# Patient Record
Sex: Male | Born: 1990 | Race: White | Hispanic: Yes | Marital: Married | Smoking: Never smoker
Health system: Southern US, Community
[De-identification: ages and names within clinical notes are randomized; demographics above are authoritative.]

---

## 2015-10-11 ENCOUNTER — Ambulatory Visit
Admission: EM | Admit: 2015-10-11 | Discharge: 2015-10-11 | Disposition: A | Discharge status: Home / Self Care | Payer: Self-pay | Attending: Family Medicine | Admitting: Family Medicine

## 2015-10-11 ENCOUNTER — Ambulatory Visit (INDEPENDENT_AMBULATORY_CARE_PROVIDER_SITE_OTHER): Payer: Self-pay

## 2015-10-11 ENCOUNTER — Encounter: Payer: Self-pay | Admitting: *Deleted

## 2015-10-11 DIAGNOSIS — M10072 Idiopathic gout, left ankle and foot: Secondary | ICD-10-CM

## 2015-10-11 DIAGNOSIS — M109 Gout, unspecified: Secondary | ICD-10-CM

## 2015-10-11 LAB — BASIC METABOLIC PANEL
Anion gap: 8 (ref 5–15)
BUN: 25 mg/dL — AB (ref 6–20)
CHLORIDE: 103 mmol/L (ref 101–111)
CO2: 26 mmol/L (ref 22–32)
Calcium: 9.3 mg/dL (ref 8.9–10.3)
Creatinine, Ser: 1.57 mg/dL — ABNORMAL HIGH (ref 0.61–1.24)
GFR calc non Af Amer: >60 mL/min (ref >60)
Glucose, Bld: 126 mg/dL — ABNORMAL HIGH (ref 65–99)
POTASSIUM: 3.9 mmol/L (ref 3.5–5.1)
SODIUM: 137 mmol/L (ref 135–145)

## 2015-10-11 LAB — URIC ACID: URIC ACID, SERUM: 11.9 mg/dL — AB (ref 4.4–7.6)

## 2015-10-11 LAB — CBC WITH DIFFERENTIAL/PLATELET
Basophils Absolute: 0.1 10*3/uL (ref 0–0.1)
Basophils Relative: 1 %
EOS ABS: 0.2 10*3/uL (ref 0–0.7)
Eosinophils Relative: 3 %
HCT: 43.1 % (ref 40.0–52.0)
HEMOGLOBIN: 14.8 g/dL (ref 13.0–18.0)
LYMPHS ABS: 2.3 10*3/uL (ref 1.0–3.6)
Lymphocytes Relative: 30 %
MCH: 29.3 pg (ref 26.0–34.0)
MCHC: 34.4 g/dL (ref 32.0–36.0)
MCV: 85.3 fL (ref 80.0–100.0)
Monocytes Absolute: 0.4 10*3/uL (ref 0.2–1.0)
Monocytes Relative: 6 %
NEUTROS ABS: 4.5 10*3/uL (ref 1.4–6.5)
NEUTROS PCT: 60 %
Platelets: 263 10*3/uL (ref 150–440)
RBC: 5.06 MIL/uL (ref 4.40–5.90)
RDW: 12.4 % (ref 11.5–14.5)
WBC: 7.6 10*3/uL (ref 3.8–10.6)

## 2015-10-11 MED ORDER — OXYCODONE-ACETAMINOPHEN 5-325 MG PO TABS: 1.0000 | ORAL_TABLET | Freq: Three times a day (TID) | ORAL | Status: DC | PRN

## 2015-10-11 MED ORDER — PREDNISONE 10 MG PO TABS: ORAL_TABLET | ORAL | Status: DC

## 2015-10-11 NOTE — ED Notes (Addendum)
Patient started having symptoms of left foot pain 4 days ago and the pain became severe last PM. Swelling is visible at the joint above the left great toe. Patient reports having the same pain in the same location 1 year ago.

## 2015-10-11 NOTE — Discharge Instructions (Signed)
Take medication as prescribed. Rest. Drink plenty of fluids. Elevate and rest.   Follow up with your primary care physician this week as needed. Return to Urgent care for new or worsening concerns.   Gout Gout is an inflammatory arthritis caused by a buildup of uric acid crystals in the joints. Uric acid is a chemical that is normally present in the blood. When the level of uric acid in the blood is too high it can form crystals that deposit in your joints and tissues. This causes joint redness, soreness, and swelling (inflammation). Repeat attacks are common. Over time, uric acid crystals can form into masses (tophi) near a joint, destroying bone and causing disfigurement. Gout is treatable and often preventable. CAUSES  The disease begins with elevated levels of uric acid in the blood. Uric acid is produced by your body when it breaks down a naturally found substance called purines. Certain foods you eat, such as meats and fish, contain high amounts of purines. Causes of an elevated uric acid level include:  Being passed down from parent to child (heredity).  Diseases that cause increased uric acid production (such as obesity, psoriasis, and certain cancers).  Excessive alcohol use.  Diet, especially diets rich in meat and seafood.  Medicines, including certain cancer-fighting medicines (chemotherapy), water pills (diuretics), and aspirin.  Chronic kidney disease. The kidneys are no longer able to remove uric acid well.  Problems with metabolism. Conditions strongly associated with gout include:  Obesity.  High blood pressure.  High cholesterol.  Diabetes. Not everyone with elevated uric acid levels gets gout. It is not understood why some people get gout and others do not. Surgery, joint injury, and eating too much of certain foods are some of the factors that can lead to gout attacks. SYMPTOMS   An attack of gout comes on quickly. It causes intense pain with redness, swelling,  and warmth in a joint.  Fever can occur.  Often, only one joint is involved. Certain joints are more commonly involved:  Base of the big toe.  Knee.  Ankle.  Wrist.  Finger. Without treatment, an attack usually goes away in a few days to weeks. Between attacks, you usually will not have symptoms, which is different from many other forms of arthritis. DIAGNOSIS  Your caregiver will suspect gout based on your symptoms and exam. In some cases, tests may be recommended. The tests may include:  Blood tests.  Urine tests.  X-rays.  Joint fluid exam. This exam requires a needle to remove fluid from the joint (arthrocentesis). Using a microscope, gout is confirmed when uric acid crystals are seen in the joint fluid. TREATMENT  There are two phases to gout treatment: treating the sudden onset (acute) attack and preventing attacks (prophylaxis).  Treatment of an Acute Attack.  Medicines are used. These include anti-inflammatory medicines or steroid medicines.  An injection of steroid medicine into the affected joint is sometimes necessary.  The painful joint is rested. Movement can worsen the arthritis.  You may use warm or cold treatments on painful joints, depending which works best for you.  Treatment to Prevent Attacks.  If you suffer from frequent gout attacks, your caregiver may advise preventive medicine. These medicines are started after the acute attack subsides. These medicines either help your kidneys eliminate uric acid from your body or decrease your uric acid production. You may need to stay on these medicines for a very long time.  The early phase of treatment with preventive medicine can be  associated with an increase in acute gout attacks. For this reason, during the first few months of treatment, your caregiver may also advise you to take medicines usually used for acute gout treatment. Be sure you understand your caregiver's directions. Your caregiver may make  several adjustments to your medicine dose before these medicines are effective.  Discuss dietary treatment with your caregiver or dietitian. Alcohol and drinks high in sugar and fructose and foods such as meat, poultry, and seafood can increase uric acid levels. Your caregiver or dietitian can advise you on drinks and foods that should be limited. HOME CARE INSTRUCTIONS   Do not take aspirin to relieve pain. This raises uric acid levels.  Only take over-the-counter or prescription medicines for pain, discomfort, or fever as directed by your caregiver.  Rest the joint as much as possible. When in bed, keep sheets and blankets off painful areas.  Keep the affected joint raised (elevated).  Apply warm or cold treatments to painful joints. Use of warm or cold treatments depends on which works best for you.  Use crutches if the painful joint is in your leg.  Drink enough fluids to keep your urine clear or pale yellow. This helps your body get rid of uric acid. Limit alcohol, sugary drinks, and fructose drinks.  Follow your dietary instructions. Pay careful attention to the amount of protein you eat. Your daily diet should emphasize fruits, vegetables, whole grains, and fat-free or low-fat milk products. Discuss the use of coffee, vitamin C, and cherries with your caregiver or dietitian. These may be helpful in lowering uric acid levels.  Maintain a healthy body weight. SEEK MEDICAL CARE IF:   You develop diarrhea, vomiting, or any side effects from medicines.  You do not feel better in 24 hours, or you are getting worse. SEEK IMMEDIATE MEDICAL CARE IF:   Your joint becomes suddenly more tender, and you have chills or a fever. MAKE SURE YOU:   Understand these instructions.  Will watch your condition.  Will get help right away if you are not doing well or get worse.   This information is not intended to replace advice given to you by your health care provider. Make sure you discuss  any questions you have with your health care provider.   Document Released: 05/23/2000 Document Revised: 06/16/2014 Document Reviewed: 01/07/2012 Elsevier Interactive Patient Education [employer]

## 2015-10-11 NOTE — ED Provider Notes (Signed)
[facility]  ____________________________________________  Time seen: Approximately 1:46 PM  I have reviewed the triage vital signs and the nursing notes.   HISTORY  Chief Complaint Foot Pain   HPI James Werner is a 25 y.o. male presents with a complaint of left great toe pain. Patient reports pain onset was 4 days ago. Patient denies fall or trauma. Denies known cause of pain. Patient does reports that approximately 1 year ago he had the exact same thing that occurred. Patient reports at that time the pain lasted for 5 or 6 days and then fully resolved without him seeking medical treatment. Patient reports that he did take over-the-counter ibuprofen then and has been this time with minimal improvement.   Denies trauma or known injury. Reports continues to ambulate well. States pain to just beneath left great toe. Also reports swelling to same area. Reports area beneath the left great toe is tender even to light touch. Patient states that when the sheet at night is touching his toe it is too much weight and causes pain. Denies any other pain. Denies fevers. Denies break in skin or redness. Has never had this evaluated previously. Denies family members with similar occurring to them. Patient does report over the last week he has eaten a lot of red meat and seafood.  Denies fevers, chest pain, shortness of breath, abdominal pain, dysuria, neck pain, back pain, other extremity pain or other extremity swelling.   History reviewed. No pertinent past medical history. denies There are no active problems to display for this patient. denies  History reviewed. No pertinent past surgical history.  No current outpatient prescriptions on file.  Allergies Review of patient's allergies indicates no known allergies.   family history. Mother: DM  Social History Social History  Substance Use Topics  . Smoking status: Never Smoker   . Smokeless tobacco: Never Used  . Alcohol  Use: Yes    Review of Systems Constitutional: No fever/chills Eyes: No visual changes. ENT: No sore throat. Cardiovascular: Denies chest pain. Respiratory: Denies shortness of breath. Gastrointestinal: No abdominal pain.  No nausea, no vomiting.  No diarrhea.  No constipation. Genitourinary: Negative for dysuria. Musculoskeletal: Negative for back pain.Left great toe pain. Skin: Negative for rash. Neurological: Negative for headaches, focal weakness or numbness.  10-point ROS otherwise negative.  ____________________________________________   PHYSICAL EXAM:  VITAL SIGNS: ED Triage Vitals  Enc Vitals Group     BP 10/11/15 1304 155/93 mmHg     Pulse Rate 10/11/15 1304 109     Resp 10/11/15 1304 18     Temp 10/11/15 1304 98.9 F (37.2 C)     Temp Source 10/11/15 1304 Oral     SpO2 10/11/15 1304 99 %     Weight 10/11/15 1304 218 lb (98.884 kg)     Height 10/11/15 1304  (1.651 m)     Head Cir --      Peak Flow --      Pain Score 10/11/15 1305 8     Pain Loc --      Pain Edu? --      Excl. in GC? --    Today's Vitals   10/11/15 1304 10/11/15 1305 10/11/15 1444 10/11/15 1446  BP: 155/93  136/82   Pulse: 109  79   Temp: 98.9 F (37.2 C)     TempSrc: Oral     Resp: 18     Height:  (1.651 m)     Weight: 218 lb (  98.884 kg)     SpO2: 99%     PainSc:  8   8    Constitutional: Alert and oriented. Well appearing and in no acute distress. Eyes: Conjunctivae are normal. PERRL. EOMI. Head: Atraumatic.  Nose: No congestion/rhinnorhea.  Mouth/Throat: Mucous membranes are moist.  Neck: No stridor.  No cervical spine tenderness to palpation. Cardiovascular: Normal rate, regular rhythm. Grossly normal heart sounds.  Good peripheral circulation. Respiratory: Normal respiratory effort.  No retractions. Lungs CTAB. Gastrointestinal: Soft and nontender.  Musculoskeletal: No lower or upper extremity tenderness nor edema.  Bilateral pedal pulses equal and easily palpated.   Except: Left foot at first MCP moderate tenderness to palpation, mild swelling, minimal erythema, pain present to light touch, sensation intact to all left foot, left great toe mild pain with flexion and extension full range of motion present, left foot otherwise nontender. No calf tenderness bilaterally. Neurologic:  Normal speech and language. No gross focal neurologic deficits are appreciated. No gait instability. Skin:  Skin is warm, dry and intact. No rash noted. Psychiatric: Mood and affect are normal. Speech and behavior are normal.  ____________________________________________   LABS (all labs ordered are listed, but only abnormal results are displayed)  Labs Reviewed  BASIC METABOLIC PANEL - Abnormal; Notable for the following:    Glucose, Bld 126 (*)    BUN 25 (*)    Creatinine, Ser 1.57 (*)    All other components within normal limits  URIC ACID - Abnormal; Notable for the following:    Uric Acid, Serum 11.9 (*)    All other components within normal limits  CBC WITH DIFFERENTIAL/PLATELET   RADIOLOGY  EXAM: LEFT GREAT TOE  COMPARISON: None.  FINDINGS: There is diffuse soft tissue swelling in the left first ray. No tophaceous soft tissue calcifications. No fracture, dislocation, joint erosions, degenerative arthropathy or suspicious focal osseous lesions.  IMPRESSION: Soft tissue swelling in the left first ray, with no tophaceous soft tissue calcifications or appreciable erosive arthropathy.   Electronically Signed By: Delbert PhenixJason A Poff M.D. On: 10/11/2015 14:14    INITIAL IMPRESSION / ASSESSMENT AND PLAN / ED COURSE  Pertinent labs & imaging results that were available during my care of the patient were reviewed by me and considered in my medical decision making (see chart for details).  Very well-appearing patient. No acute distress. Presents for the complaints of left great toe pain. Patient reports pain is present even to light touch. Patient does  report history of this occurring similarly 1 year ago and resolved on its own. Clinical appearance and subjective report consistent with acute gouty pain. Will evaluate left great toe x-ray, CBC, BMP and uric acid as has not had this previously evaluated.  Patient labs reviewed. Patient BUN 25, creatinine 1.57. Patient denies any history of renal insufficiency. Suspect patient not drinking fluids as much as normal and discussed with patient concern if he had been taking too many NSAIDs over-the-counter, patient states he has not taken NSAIDs every single day but intermittently. As patient creatinine function slightly elevated will avoid NSAIDs as well as patient does not have insurance and discussion had with patient due to cost of medications will avoid use of colchicine at this time. Will treat patient with prednisone taper as well as when necessary Percocet for breakthrough pain. Patient denies need for crutches. Encourage rest, elevation, and discussed dietary triggers. Encouraged patient to establish in follow-up closely with her primary care physician monitoring renal function as well as blood pressure. Information for  Dr. Electa Sniff given. Patient states that he will call today to schedule follow-up.Discussed indication, risks and benefits of medications with patient.  Discussed follow up with Primary care physician this week. Discussed follow up and return parameters including no resolution or any worsening concerns. Patient verbalized understanding and agreed to plan.   ____________________________________________   FINAL CLINICAL IMPRESSION(S) / ED DIAGNOSES  Final diagnoses:  Acute gout of left foot, unspecified cause      Note: This dictation was prepared with Dragon dictation along with smaller phrase technology. Any transcriptional errors that result from this process are unintentional.    Renford Dills, NP 10/11/15 1531  Renford Dills, NP 10/11/15 1540

## 2016-01-18 ENCOUNTER — Emergency Department: Payer: Self-pay

## 2016-01-18 ENCOUNTER — Ambulatory Visit
Admission: EM | Admit: 2016-01-18 | Discharge: 2016-01-18 | Disposition: A | Discharge status: Other Acute Inpatient Hospital | Payer: Self-pay | Attending: Emergency Medicine | Admitting: Emergency Medicine

## 2016-01-18 ENCOUNTER — Emergency Department
Admission: EM | Admit: 2016-01-18 | Discharge: 2016-01-18 | Disposition: A | Discharge status: Home / Self Care | Payer: Self-pay | Attending: Student | Admitting: Student

## 2016-01-18 ENCOUNTER — Encounter: Payer: Self-pay | Admitting: Emergency Medicine

## 2016-01-18 DIAGNOSIS — N5089 Other specified disorders of the male genital organs: Secondary | ICD-10-CM

## 2016-01-18 DIAGNOSIS — N50811 Right testicular pain: Secondary | ICD-10-CM

## 2016-01-18 DIAGNOSIS — R110 Nausea: Secondary | ICD-10-CM | POA: Insufficient documentation

## 2016-01-18 DIAGNOSIS — N50819 Testicular pain, unspecified: Secondary | ICD-10-CM

## 2016-01-18 DIAGNOSIS — N451 Epididymitis: Secondary | ICD-10-CM | POA: Insufficient documentation

## 2016-01-18 LAB — URINALYSIS COMPLETE WITH MICROSCOPIC (ARMC ONLY)
Bacteria, UA: NONE SEEN
Bacteria, UA: NONE SEEN
Bilirubin Urine: NEGATIVE
Bilirubin Urine: NEGATIVE
Glucose, UA: NEGATIVE mg/dL
Glucose, UA: NEGATIVE mg/dL
KETONES UR: NEGATIVE mg/dL
Ketones, ur: NEGATIVE mg/dL
LEUKOCYTES UA: NEGATIVE
Leukocytes, UA: NEGATIVE
NITRITE: NEGATIVE
Nitrite: NEGATIVE
PH: 5 (ref 5.0–8.0)
PH: 5 (ref 5.0–8.0)
Protein, ur: >500 mg/dL — AB
SPECIFIC GRAVITY, URINE: 1.025 (ref 1.005–1.030)
SQUAMOUS EPITHELIAL / LPF: NONE SEEN
Specific Gravity, Urine: 1.018 (ref 1.005–1.030)
WBC UA: NONE SEEN WBC/hpf (ref 0–5)

## 2016-01-18 LAB — CBC WITH DIFFERENTIAL/PLATELET
BASOS PCT: 1 %
Basophils Absolute: 0.1 10*3/uL (ref 0–0.1)
EOS ABS: 0 10*3/uL (ref 0–0.7)
Eosinophils Relative: 0 %
HEMATOCRIT: 40.3 % (ref 40.0–52.0)
Hemoglobin: 13.9 g/dL (ref 13.0–18.0)
Lymphocytes Relative: 20 %
Lymphs Abs: 2.3 10*3/uL (ref 1.0–3.6)
MCH: 29.3 pg (ref 26.0–34.0)
MCHC: 34.4 g/dL (ref 32.0–36.0)
MCV: 85.1 fL (ref 80.0–100.0)
MONO ABS: 0.9 10*3/uL (ref 0.2–1.0)
MONOS PCT: 8 %
Neutro Abs: 8.1 10*3/uL — ABNORMAL HIGH (ref 1.4–6.5)
Neutrophils Relative %: 71 %
Platelets: 327 10*3/uL (ref 150–440)
RBC: 4.74 MIL/uL (ref 4.40–5.90)
RDW: 13 % (ref 11.5–14.5)
WBC: 11.4 10*3/uL — ABNORMAL HIGH (ref 3.8–10.6)

## 2016-01-18 LAB — CHLAMYDIA/NGC RT PCR (ARMC ONLY)
Chlamydia Tr: NOT DETECTED
N GONORRHOEAE: NOT DETECTED

## 2016-01-18 LAB — BASIC METABOLIC PANEL
Anion gap: 9 (ref 5–15)
BUN: 22 mg/dL — AB (ref 6–20)
CALCIUM: 9.4 mg/dL (ref 8.9–10.3)
CO2: 22 mmol/L (ref 22–32)
CREATININE: 1.2 mg/dL (ref 0.61–1.24)
Chloride: 107 mmol/L (ref 101–111)
GFR calc Af Amer: >60 mL/min (ref >60)
GFR calc non Af Amer: >60 mL/min (ref >60)
GLUCOSE: 95 mg/dL (ref 65–99)
Potassium: 3.5 mmol/L (ref 3.5–5.1)
Sodium: 138 mmol/L (ref 135–145)

## 2016-01-18 MED ORDER — ONDANSETRON 4 MG PO TBDP
4.0000 mg | ORAL_TABLET | Freq: Once | ORAL | Status: AC
  Administered 2016-01-18: 4 mg via ORAL

## 2016-01-18 MED ORDER — OXYCODONE HCL 5 MG PO TABS
5.0000 mg | ORAL_TABLET | Freq: Four times a day (QID) | ORAL | 0 refills | Status: DC | PRN
Start: 2016-01-18 — End: 2019-11-09

## 2016-01-18 MED ORDER — ONDANSETRON 4 MG PO TBDP
ORAL_TABLET | ORAL | Status: AC
  Administered 2016-01-18: 4 mg via ORAL
  Filled 2016-01-18: qty 1

## 2016-01-18 MED ORDER — MORPHINE SULFATE (PF) 4 MG/ML IV SOLN
INTRAVENOUS | Status: AC
  Administered 2016-01-18: 4 mg via INTRAMUSCULAR
  Filled 2016-01-18: qty 1

## 2016-01-18 MED ORDER — MORPHINE SULFATE (PF) 4 MG/ML IV SOLN
4.0000 mg | Freq: Once | INTRAVENOUS | Status: AC
  Administered 2016-01-18: 4 mg via INTRAMUSCULAR

## 2016-01-18 MED ORDER — AZITHROMYCIN 250 MG PO TABS
1000.0000 mg | ORAL_TABLET | Freq: Once | ORAL | Status: AC
  Administered 2016-01-18: 1000 mg via ORAL
  Filled 2016-01-18: qty 4

## 2016-01-18 MED ORDER — ACETAMINOPHEN 500 MG PO TABS
ORAL_TABLET | ORAL | Status: AC
  Administered 2016-01-18: 1000 mg via ORAL
  Filled 2016-01-18: qty 2

## 2016-01-18 MED ORDER — LEVOFLOXACIN 500 MG PO TABS: 500.0000 mg | ORAL_TABLET | Freq: Every day | ORAL | 0 refills | Status: AC

## 2016-01-18 MED ORDER — ACETAMINOPHEN 500 MG PO TABS
1000.0000 mg | ORAL_TABLET | Freq: Once | ORAL | Status: AC
  Administered 2016-01-18: 1000 mg via ORAL

## 2016-01-18 MED ORDER — CEFTRIAXONE SODIUM 250 MG IJ SOLR
250.0000 mg | Freq: Once | INTRAMUSCULAR | Status: AC
  Administered 2016-01-18: 250 mg via INTRAMUSCULAR
  Filled 2016-01-18: qty 250

## 2016-01-18 NOTE — ED Triage Notes (Signed)
Pt c/o severe right testicular pain and swelling since last night. Fever in triage. No redness per pt.

## 2016-01-18 NOTE — ED Triage Notes (Signed)
Patient c/o pain and swelling in his right testicle that started yesterday.

## 2016-01-18 NOTE — Discharge Instructions (Signed)
It is difficult to tell if this is an infection or if this is testicular torsion. I am sending you to the emergency room to have an ultrasound done. Go there now. I'm giving you information on both testicular/epididymal infections and torsion.

## 2016-01-18 NOTE — ED Notes (Signed)
MD at bedside. 

## 2016-01-18 NOTE — ED Provider Notes (Signed)
[facility] Emergency Department Provider Note   ____________________________________________   First MD Initiated Contact with Patient 01/18/16 1453     (approximate)  I have reviewed the triage vital signs and the nursing notes.   HISTORY  Chief Complaint Testicle Pain    HPI Aydrien Froman is a 25 y.o. male with no chronic medical problems who presents for evaluation of gradual onset constant severe right testicular pain since last night, no modifying factors, no trauma. He has also had fever and nausea but no vomiting or diarrhea. No chest pain difficulty breathing. Seen at urgent care and concern was for possible epididymitis however could not rule out torsion without ultrasounds that he was referred to the emergency department. He denies concern for STD, he is emotionally monogamous sexual relationship with his spouse. He denies any dysuria or hematuria.   History reviewed. No pertinent past medical history.  There are no active problems to display for this patient.   History reviewed. No pertinent surgical history.  Prior to Admission medications   Not on File    Allergies Review of patient's allergies indicates no known allergies.  History reviewed. No pertinent family history.  Social History Social History  Substance Use Topics  . Smoking status: Never Smoker  . Smokeless tobacco: Never Used  . Alcohol use Yes    Review of Systems Constitutional: + fever, nochills Eyes: No visual changes. ENT: No sore throat. Cardiovascular: Denies chest pain. Respiratory: Denies shortness of breath. Gastrointestinal: No abdominal pain.  + nausea, no vomiting.  No diarrhea.  No constipation. Genitourinary: Negative for dysuria. Musculoskeletal: Negative for back pain. Skin: Negative for rash. Neurological: Negative for headaches, focal weakness or numbness.  10-point ROS otherwise  negative.  ____________________________________________   PHYSICAL EXAM:  Vitals:   01/18/16 1348 01/18/16 1349 01/18/16 1643  BP: (!) 142/90  130/73  Pulse: (!) 104  78  Resp: 18  18  Temp: (!) 100.9 F (38.3 C)  98 F (36.7 C)  TempSrc: Oral  Oral  SpO2: 100%  96%  Weight:  220 lb (99.8 kg)   Height:   (1.626 m)     VITAL SIGNS: ED Triage Vitals  Enc Vitals Group     BP 01/18/16 1348 (!) 142/90     Pulse Rate 01/18/16 1348 (!) 104     Resp 01/18/16 1348 18     Temp 01/18/16 1348 (!) 100.9 F (38.3 C)     Temp Source 01/18/16 1348 Oral     SpO2 01/18/16 1348 100 %     Weight 01/18/16 1349 220 lb (99.8 kg)     Height 01/18/16 1349  (1.626 m)     Head Circumference --      Peak Flow --      Pain Score 01/18/16 1349 9     Pain Loc --      Pain Edu? --      Excl. in GC? --     Constitutional: Alert and oriented. Well appearing and in no acute distress. Eyes: Conjunctivae are normal. PERRL. EOMI. Head: Atraumatic. Nose: No congestion/rhinnorhea. Mouth/Throat: Mucous membranes are moist.  Oropharynx non-erythematous. Neck: No stridor.  Supple without meningismus. Cardiovascular: Normal rate, regular rhythm. Grossly normal heart sounds.  Good peripheral circulation. Respiratory: Normal respiratory effort.  No retractions. Lungs CTAB. Gastrointestinal: Soft and nontender. No distention.  No CVA tenderness. Genitourinary: tenderness in the anterior portion and posterior portion of the right testicle, there is a small less  than 1 cm area of swelling on the anterior testicle that is mobile. Musculoskeletal: No lower extremity tenderness nor edema.  No joint effusions. Neurologic:  Normal speech and language. No gross focal neurologic deficits are appreciated. No gait instability. Skin:  Skin is warm, dry and intact. No rash noted. Psychiatric: Mood and affect are normal. Speech and behavior are normal.  ____________________________________________    LABS (all labs ordered are listed, but only abnormal results are displayed)  Labs Reviewed  URINALYSIS COMPLETEWITH MICROSCOPIC ([facility] ONLY) - Abnormal; Notable for the following:       Result Value   Color, Urine YELLOW (*)    APPearance HAZY (*)    Hgb urine dipstick 1+ (*)    Protein, ur >500 (*)    All other components within normal limits  BASIC METABOLIC PANEL - Abnormal; Notable for the following:    BUN 22 (*)    All other components within normal limits  CBC WITH DIFFERENTIAL/PLATELET - Abnormal; Notable for the following:    WBC 11.4 (*)    Neutro Abs 8.1 (*)    All other components within normal limits   ____________________________________________  EKG  none ____________________________________________  RADIOLOGY  US scrotum IMPRESSION: 1. Right epididymitis/orchitis. 2. No testicular masses. No torsion. 3. 12 mm right epididymal head cyst. ____________________________________________   PROCEDURES  Procedure(s) performed: None  Procedures  Critical Care performed: No  ____________________________________________   INITIAL IMPRESSION / ASSESSMENT AND PLAN / ED COURSE  Pertinent labs & imaging results that were available during my care of the patient were reviewed by me and considered in my medical decision making (see chart for details).  Forestine Nagnacio Suchy is a 25 y.o. male with no chronic medical problems who presents for evaluation of gradual onset constant severe right testicular pain since last night. On exam, he is generally well-appearing and in no acute distress. He has a fever with a temperature of 100.9, mildly tachycardic on his arrival, we assessed. The remainder of his vital signs are stable and he is afebrile. He does have tenderness throughout the right testicle as well as a small area of swelling/mobile tiny lump in the right hemiscrotum. We'll obtain screening labs, urinalysis as well as ultrasound to rule out torsion. We'll treat  his pain and reassess for disposition. GC and chlamydia tests done at the urgent care center earlier today.  ----------------------------------------- 4:57 PM on 01/18/2016 ----------------------------------------- Patient with a significant improvement of his pain. His fever has resolved, temp is normal at 87F. Tachycardia has resolved, heart rate at discharge is 78. Ultrasound shows epididymitis without abscess, or torsion. He received empiric ceftriaxone and azithromycin in the emergency department, we'll discharge with Levaquin, PCP and urology follow-up. CBC with mild leukocytosis, white blood cell count 11.4, normal BMP, urinalysis not consistent with infection. Discussed return precautions and he is comfortable with the discharge plan. DC home.  Clinical Course     ____________________________________________   FINAL CLINICAL IMPRESSION(S) / ED DIAGNOSES  Final diagnoses:  Testicle pain  Acute epididymitis      NEW MEDICATIONS STARTED DURING THIS VISIT:  New Prescriptions   No medications on file     Note:  This document was prepared using Dragon voice recognition software and may include unintentional dictation errors.    Gayla DossEryka A Ariely Riddell, MD 01/18/16 [phone]

## 2016-01-18 NOTE — ED Provider Notes (Signed)
HPI  SUBJECTIVE:  James Werner is a 25 y.o. male who presents with gradual onset of right testicular pain, swelling, erythema. Patient states that he is a painful "lump" on his testicle starting last night. States that the pain is squeezing, intermittent, lasts seconds. Symptoms are worse with walking, no alleviating factors. He tried ice and Tylenol last night. Last dose at 2300. He has not tried elevation. He reports nausea, right groin pain, fevers Tmax of 100.0. He reports fatigue and a constant, frontal headache. He denies dysuria, urinary urgency, frequency, cloudy or odorous urine, hematuria. No penile rash, penile discharge. No facial swelling or viral symptoms. No testicular trauma. Denies neck stiffness, photophobia, phonophobia, dysarthria, discrimination, or leg weakness, facial droop. States that he has had similar headaches before. He is in a monogamous sexual relationship with his wife who is asymptomatic. STDs are not a concern today. His past medical history of headaches, gout. No history of diabetes, hypertension, gonorrhea, chlamydia, herpes, HIV, syphilis, Trichomonas, prostatitis. No history of epididymitis, orchitis or testicular torsion.    History reviewed. No pertinent past medical history.  History reviewed. No pertinent surgical history.  History reviewed. No pertinent family history.  Social History  Substance Use Topics  . Smoking status: Never Smoker  . Smokeless tobacco: Never Used  . Alcohol use Yes    No current facility-administered medications for this encounter.  No current outpatient prescriptions on file.  No Known Allergies   ROS  As noted in HPI.   Physical Exam  BP 129/79 (BP Location: Left Arm)   Pulse 98   Temp 98.9 F (37.2 C) (Tympanic)   Resp 16   SpO2 98%   Constitutional: Well developed, well nourished, no acute distress Eyes:  EOMI, conjunctiva normal bilaterally HENT: Normocephalic, atraumatic,mucus membranes  moist Respiratory: Normal inspiratory effort Cardiovascular: Normal rate GI: nondistended. Normal appearance, soft, nontender. No right lower quadrant tenderness. No suprapubic tenderness  Back: No CVA tenderness GU: Uncircumcised male. No penile rash or discharge. Right testicular erythema, diffuse testicular tenderness and swelling. Tender mobile mass at the epididymis.  Prostate exam: Nonenlarged, nontender. Patient declined chaperone. skin: No rash, skin intact Musculoskeletal: no deformities Neurologic: Alert & oriented x 3, no focal neuro deficits Psychiatric: Speech and behavior appropriate   ED Course   Medications - No data to display  Orders Placed This Encounter  Procedures  . Chlamydia/NGC rt PCR    Standing Status:   Standing    Number of Occurrences:   1  . Urinalysis complete, with microscopic    Standing Status:   Standing    Number of Occurrences:   1    No results found for this or any previous visit (from the past 24 hour(s)). No results found.  ED Clinical Impression  Testicular pain, right  Testicular swelling, right   ED Assessment/Plan  Presentation suggestive of epididymitis or orchitis, however, cannot rule out testicular torsion   or torsion of the epididymal appendix. Favor epididymitis, however, transferring to the ED to have an ultrasound done as this is not available at our facility. Sent off urine GC, UA, urine culture. the patient is at very low risk for STDs, however, testing was done. Discussed rationale for transfer with patient. He agrees with plan and states that he will go there immediately.  *This clinic note was created using Dragon dictation software. Therefore, there may be occasional mistakes despite careful proofreading.  ?    Domenick GongAshley Wendie Diskin, MD 01/18/16 1321

## 2016-01-18 NOTE — ED Notes (Signed)
Patient states his right testicle is swollen, warm to the touch, painful, and pain is now radiating to right side of groin.  States he has been running low grade fevers.  Also explained that he feels a "pea-sized" lump on the top side of the right testicle.  Patient denies any new sexual partners or h/o STD's.

## 2016-01-19 LAB — URINE CULTURE: CULTURE: NO GROWTH

## 2019-11-09 ENCOUNTER — Emergency Department: Payer: Self-pay

## 2019-11-09 ENCOUNTER — Other Ambulatory Visit: Payer: Self-pay

## 2019-11-09 ENCOUNTER — Encounter: Payer: Self-pay | Admitting: Emergency Medicine

## 2019-11-09 ENCOUNTER — Emergency Department
Admission: EM | Admit: 2019-11-09 | Discharge: 2019-11-09 | Disposition: A | Discharge status: Home / Self Care | Payer: Self-pay | Attending: Student in an Organized Health Care Education/Training Program | Admitting: Student in an Organized Health Care Education/Training Program

## 2019-11-09 DIAGNOSIS — R590 Localized enlarged lymph nodes: Secondary | ICD-10-CM | POA: Insufficient documentation

## 2019-11-09 DIAGNOSIS — R1031 Right lower quadrant pain: Secondary | ICD-10-CM | POA: Insufficient documentation

## 2019-11-09 DIAGNOSIS — N50811 Right testicular pain: Secondary | ICD-10-CM | POA: Insufficient documentation

## 2019-11-09 LAB — URINALYSIS, COMPLETE (UACMP) WITH MICROSCOPIC
Bacteria, UA: NONE SEEN
Bilirubin Urine: NEGATIVE
Glucose, UA: NEGATIVE mg/dL
Hgb urine dipstick: NEGATIVE
Ketones, ur: NEGATIVE mg/dL
Leukocytes,Ua: NEGATIVE
Nitrite: NEGATIVE
Protein, ur: 100 mg/dL — AB
Specific Gravity, Urine: 1.017 (ref 1.005–1.030)
Squamous Epithelial / LPF: NONE SEEN (ref 0–5)
pH: 5 (ref 5.0–8.0)

## 2019-11-09 MED ORDER — LEVOFLOXACIN 500 MG PO TABS: 500.0000 mg | ORAL_TABLET | Freq: Every day | ORAL | 0 refills | Status: AC

## 2019-11-09 MED ORDER — CEFTRIAXONE SODIUM 250 MG IJ SOLR
250.0000 mg | Freq: Once | INTRAMUSCULAR | Status: AC
  Administered 2019-11-09: 250 mg via INTRAMUSCULAR
  Filled 2019-11-09: qty 250

## 2019-11-09 MED ORDER — LIDOCAINE HCL (PF) 1 % IJ SOLN
INTRAMUSCULAR | Status: AC
  Administered 2019-11-09: 5 mL
  Filled 2019-11-09: qty 5

## 2019-11-09 MED ORDER — LIDOCAINE HCL (PF) 1 % IJ SOLN: 5.0000 mL | Freq: Once | INTRAMUSCULAR | Status: AC

## 2019-11-09 MED ORDER — HYDROCODONE-ACETAMINOPHEN 5-325 MG PO TABS: 1.0000 | ORAL_TABLET | ORAL | 0 refills | Status: DC | PRN

## 2019-11-09 NOTE — ED Notes (Signed)
See triage note  Presents with pain to rlq which moves into groin  States pain has been there for a while but increased in intensity  Denies any trauma,n/v/d or blood in urine

## 2019-11-09 NOTE — ED Triage Notes (Signed)
Pt reports started with pain in his right testicle for awhile and now the pain radiates into his right lower abd

## 2019-11-09 NOTE — ED Provider Notes (Signed)
[facility] Emergency Department Provider Note    First MD Initiated Contact with Patient 11/09/19 1205     (approximate)  I have reviewed the triage vital signs and the nursing notes.   HISTORY  Chief Complaint Groin Pain    HPI James Werner is a 29 y.o. male with no significant past medical history presents to the ER for  evaluation of several weeks of progressively worsening right groin and testicle pain.  Feels like he is having some swelling of his right testicle.  Does have a history of epididymitis and orchitis that did improve with antibiotics previously but is also feeling like this is worse.  Denies any dysuria.  No flank pain.  No fevers.  No history of STI.  States the pain is mild to moderate in severity.   History reviewed. No pertinent past medical history. No family history on file. History reviewed. No pertinent surgical history. There are no problems to display for this patient.     Prior to Admission medications   Medication Sig Start Date End Date Taking? Authorizing Provider  HYDROcodone-acetaminophen (NORCO) 5-325 MG tablet Take 1 tablet by mouth every 4 (four) hours as needed for moderate pain. 11/09/19   Willy Eddy, MD  levofloxacin (LEVAQUIN) 500 MG tablet Take 1 tablet (500 mg total) by mouth daily for 10 days. 11/09/19 11/19/19  Willy Eddy, MD    Allergies Patient has no known allergies.    Social History Social History   Tobacco Use  . Smoking status: Never Smoker  . Smokeless tobacco: Never Used  Substance Use Topics  . Alcohol use: Yes  . Drug use: No    Review of Systems Patient denies headaches, rhinorrhea, blurry vision, numbness, shortness of breath, chest pain, edema, cough, abdominal pain, nausea, vomiting, diarrhea, dysuria, fevers, rashes or hallucinations unless otherwise stated above in HPI. ____________________________________________   PHYSICAL EXAM:  VITAL SIGNS: Vitals:     11/09/19 1105  BP: (!) 142/87  Pulse: 70  Resp: 18  Temp: 98.8 F (37.1 C)  SpO2: 99%    Constitutional: Alert and oriented.  Eyes: Conjunctivae are normal.  Head: Atraumatic. Nose: No congestion/rhinnorhea. Mouth/Throat: Mucous membranes are moist.   Neck: No stridor. Painless ROM.  Cardiovascular: Normal rate, regular rhythm. Grossly normal heart sounds.  Good peripheral circulation. Respiratory: Normal respiratory effort.  No retractions. Lungs CTAB. Gastrointestinal: Soft and nontender. No distention. No abdominal bruits. No CVA tenderness. Genitourinary: Cremasteric reflexes present bilaterally.  Tenderness to the palpation of the right testicle particular the posterior aspect.  No overlying cellulitis.  Does have some right inguinal lymphadenopathy. Musculoskeletal: No lower extremity tenderness nor edema.  No joint effusions. Neurologic:  Normal speech and language. No gross focal neurologic deficits are appreciated. No facial droop Skin:  Skin is warm, dry and intact. No rash noted. Psychiatric: Mood and affect are normal. Speech and behavior are normal.  ____________________________________________   LABS (all labs ordered are listed, but only abnormal results are displayed)  Results for orders placed or performed during the hospital encounter of 11/09/19 (from the past 24 hour(s))  Urinalysis, Complete w Microscopic     Status: Abnormal   Collection Time: 11/09/19 11:05 AM  Result Value Ref Range   Color, Urine YELLOW (A) YELLOW   APPearance CLEAR (A) CLEAR   Specific Gravity, Urine 1.017 1.005 - 1.030   pH 5.0 5.0 - 8.0   Glucose, UA NEGATIVE NEGATIVE mg/dL   Hgb urine dipstick NEGATIVE NEGATIVE  Bilirubin Urine NEGATIVE NEGATIVE   Ketones, ur NEGATIVE NEGATIVE mg/dL   Protein, ur 100 (A) NEGATIVE mg/dL   Nitrite NEGATIVE NEGATIVE   Leukocytes,Ua NEGATIVE NEGATIVE   RBC / HPF 0-5 0 - 5 RBC/hpf   WBC, UA 0-5 0 - 5 WBC/hpf   Bacteria, UA NONE SEEN NONE SEEN    Squamous Epithelial / LPF NONE SEEN 0 - 5   Mucus PRESENT    Hyaline Casts, UA PRESENT    ____________________________________________ ____________________________________________  RADIOLOGY  I personally reviewed all radiographic images ordered to evaluate for the above acute complaints and reviewed radiology reports and findings.  These findings were personally discussed with the patient.  Please see medical record for radiology report.  ____________________________________________   PROCEDURES  Procedure(s) performed:  Procedures    Critical Care performed: no ____________________________________________   INITIAL IMPRESSION / ASSESSMENT AND PLAN / ED COURSE  Pertinent labs & imaging results that were available during my care of the patient were reviewed by me and considered in my medical decision making (see chart for details).   DDX: epididymitis, orchitis, hernia, stone, cystitis  Nilesh Stegall is a 29 y.o. who presents to the ED with right groin and testicle pain as described above.  Do not appreciate any hernia.  Exam is consistent with epididymitis or "otitis.  Abdominal exam is soft benign.  Not consistent with diverticulitis, abscess, appendicitis or other acute intra-abdominal process.  Ultrasound does not show any evidence of abscess no evidence of torsion.  Given his exam findings and history of epididymitis improving after antibiotic particularly as he does not have reliable outpatient follow-up at this time I will cover with antibiotics.  States he is having monogamous relationship with spouse but will treat with Levaquin this time for recurrence and broaden coverage.  We discussed signs symptoms which he should return to the ER.     The patient was evaluated in Emergency Department today for the symptoms described in the history of present illness. He/she was evaluated in the context of the global COVID-19 pandemic, which necessitated consideration  that the patient might be at risk for infection with the SARS-CoV-2 virus that causes COVID-19. Institutional protocols and algorithms that pertain to the evaluation of patients at risk for COVID-19 are in a state of rapid change based on information released by regulatory bodies including the CDC and federal and state organizations. These policies and algorithms were followed during the patient's care in the ED.  As part of my medical decision making, I reviewed the following data within the [facility] notes reviewed and incorporated, Labs reviewed, notes from prior ED visits and [place] Controlled Substance Database   ____________________________________________   FINAL CLINICAL IMPRESSION(S) / ED DIAGNOSES  Final diagnoses:  Pain in right testicle      NEW MEDICATIONS STARTED DURING THIS VISIT:  New Prescriptions   HYDROCODONE-ACETAMINOPHEN (NORCO) 5-325 MG TABLET    Take 1 tablet by mouth every 4 (four) hours as needed for moderate pain.   LEVOFLOXACIN (LEVAQUIN) 500 MG TABLET    Take 1 tablet (500 mg total) by mouth daily for 10 days.     Note:  This document was prepared using Dragon voice recognition software and may include unintentional dictation errors.    Merlyn Lot, MD 11/09/19 1401

## 2021-06-28 ENCOUNTER — Ambulatory Visit
Admission: EM | Admit: 2021-06-28 | Discharge: 2021-06-28 | Disposition: A | Discharge status: Home / Self Care | Payer: Self-pay | Attending: Emergency Medicine | Admitting: Emergency Medicine

## 2021-06-28 ENCOUNTER — Other Ambulatory Visit: Payer: Self-pay

## 2021-06-28 DIAGNOSIS — M10071 Idiopathic gout, right ankle and foot: Secondary | ICD-10-CM

## 2021-06-28 MED ORDER — METHYLPREDNISOLONE SODIUM SUCC 40 MG IJ SOLR
60.0000 mg | Freq: Once | INTRAMUSCULAR | Status: AC
  Administered 2021-06-28: 60 mg via INTRAMUSCULAR

## 2021-06-28 MED ORDER — COLCHICINE 0.6 MG PO TABS: 0.6000 mg | ORAL_TABLET | Freq: Every day | ORAL | 0 refills | Status: DC

## 2021-06-28 MED ORDER — PREDNISONE 10 MG (21) PO TBPK: ORAL_TABLET | Freq: Every day | ORAL | 0 refills | Status: DC

## 2021-06-28 NOTE — ED Provider Notes (Signed)
[facility]    CSN: [id number] Arrival date & time: 06/28/21  4580      History   Chief Complaint Chief Complaint  Patient presents with   Gout    HPI James Werner is a 31 y.o. male.   Patient presents with right great toe and ankle pain for 11 days.  Associated sensation of heat and warmth to skin.  Symptoms are worsened by long periods of walking and endorses he does this daily as he works in [employer].  Range of motion is intact.  Denies numbness, tingling, precipitating event or trauma.  Has attempted use of dietary changes, avoidance of alcohol and over-the-counter Aleve which was not helpful was seen and a clinic on 06/18/2021 for same symptoms prescribed narcotics and prednisone course which have not relieved symptoms.  Endorses history of gout with flareup occurring 1-2 times per year.  History reviewed. No pertinent past medical history.  There are no problems to display for this patient.   History reviewed. No pertinent surgical history.     Home Medications    Prior to Admission medications   Medication Sig Start Date End Date Taking? Authorizing Provider  HYDROcodone-acetaminophen (NORCO) 5-325 MG tablet Take 1 tablet by mouth every 4 (four) hours as needed for moderate pain. 11/09/19   Willy Eddy, MD    Family History History reviewed. No pertinent family history.  Social History Social History   Tobacco Use   Smoking status: Never   Smokeless tobacco: Never  Vaping Use   Vaping Use: Never used  Substance Use Topics   Alcohol use: Yes   Drug use: No     Allergies   Patient has no known allergies.   Review of Systems Review of Systems  Constitutional: Negative.   Respiratory: Negative.    Cardiovascular: Negative.   Musculoskeletal:  Positive for arthralgias. Negative for back pain, gait problem, joint swelling, myalgias, neck pain and neck stiffness.  Skin: Negative.   Neurological: Negative.      Physical Exam Triage Vital Signs ED Triage Vitals  Enc Vitals Group     BP 06/28/21 0834 (!) 146/104     Pulse Rate 06/28/21 0834 75     Resp 06/28/21 0834 18     Temp 06/28/21 0834 97.9 F (36.6 C)     Temp Source 06/28/21 0834 Oral     SpO2 06/28/21 0834 96 %     Weight 06/28/21 0833 205 lb (93 kg)     Height 06/28/21 0833 5\' 4"  (1.626 m)     Head Circumference --      Peak Flow --      Pain Score 06/28/21 0833 9     Pain Loc --      Pain Edu? --      Excl. in GC? --    No data found.  Updated Vital Signs BP (!) 146/104 (BP Location: Left Arm)    Pulse 75    Temp 97.9 F (36.6 C) (Oral)    Resp 18    Ht 5\' 4"  (1.626 m)    Wt 205 lb (93 kg)    SpO2 96%    BMI 35.19 kg/m   Visual Acuity Right Eye Distance:   Left Eye Distance:   Bilateral Distance:    Right Eye Near:   Left Eye Near:    Bilateral Near:     Physical Exam Constitutional:      Appearance: Normal appearance.  HENT:  Head: Normocephalic.  Eyes:     Extraocular Movements: Extraocular movements intact.  Pulmonary:     Effort: Pulmonary effort is normal.  Feet:     Comments: Tenderness along the dorsal aspect of the great left toe at the proximal phalanx, tenderness along the medial aspect of the foot and ankle, no point tenderness noted, no swelling, ecchymosis noted range of motion intact, sensation intact, 2+ pedal pulse Skin:    General: Skin is warm and dry.  Neurological:     Mental Status: He is alert and oriented to person, place, and time. Mental status is at baseline.  Psychiatric:        Mood and Affect: Mood normal.        Behavior: Behavior normal.     UC Treatments / Results  Labs (all labs ordered are listed, but only abnormal results are displayed) Labs Reviewed - No data to display  EKG   Radiology No results found.  Procedures Procedures (including critical care time)  Medications Ordered in UC Medications - No data to display  Initial Impression /  Assessment and Plan / UC Course  I have reviewed the triage vital signs and the nursing notes.  Pertinent labs & imaging results that were available during my care of the patient were reviewed by me and considered in my medical decision making (see chart for details).  Acute idiopathic gout of right foot  Methylprednisolone injection given here in office to help with pain, will trial colchicine even though past 36-hour mark as patient has had success with this medication in the past and has already failed a short prednisone burst, prednisone 60 mg taper to be used if colchicine is not effective, recommended RICE, heat in 15-minute intervals, pillows for support in addition, advised to continue dietary changes to minimize uric acid, declined work note, may follow-up with urgent care as needed for persistent or reoccurring symptoms Final Clinical Impressions(s) / UC Diagnoses   Final diagnoses:  None   Discharge Instructions   None    ED Prescriptions   None    PDMP not reviewed this encounter.   Valinda Hoar, [place] 06/28/21 [phone]

## 2021-06-28 NOTE — Discharge Instructions (Addendum)
Starting tomorrow begin use of colchicine, take 2 tablets then in 1 hour take 1 tablet, then for 6 days take 1 tablet every morning  Give colchicine course does not help you may begin use of prednisone every morning with food as directed on package   If told, put ice on the painful area: Put ice in a plastic bag. Place a towel between your skin and the bag. Leave the ice on for 20 minutes, 2-3 times a day. Raise (elevate) the painful joint above the level of your heart as often as you can. Rest the joint as much as possible. May also use heat over the affected areas in 15-minute intervals  Eat a low-purine diet. Avoid foods and drinks such as: Liver. Kidney. Anchovies. Asparagus. Herring. Mushrooms. Mussels. Beer.  You may follow-up with urgent care as needed

## 2021-06-28 NOTE — ED Triage Notes (Signed)
Pt c/o pain in his feet. Pt states that he has Gout. Pt was evaluated at a walk in clinic in [place] on 06/19/21 and was given pain medication but nothing to help the gout flare up. Pt stated that the pain started on 06/17/21.

## 2021-07-09 ENCOUNTER — Encounter: Payer: Self-pay | Admitting: Emergency Medicine

## 2021-07-09 ENCOUNTER — Emergency Department
Admission: EM | Admit: 2021-07-09 | Discharge: 2021-07-09 | Disposition: A | Discharge status: Home / Self Care | Payer: Self-pay | Attending: Emergency Medicine | Admitting: Emergency Medicine

## 2021-07-09 ENCOUNTER — Other Ambulatory Visit: Payer: Self-pay

## 2021-07-09 ENCOUNTER — Emergency Department: Payer: Self-pay

## 2021-07-09 DIAGNOSIS — M25571 Pain in right ankle and joints of right foot: Secondary | ICD-10-CM | POA: Insufficient documentation

## 2021-07-09 MED ORDER — PREDNISONE 50 MG PO TABS: 50.0000 mg | ORAL_TABLET | Freq: Every day | ORAL | 0 refills | Status: DC

## 2021-07-09 NOTE — ED Provider Notes (Signed)
[facility] Provider Note    Event Date/Time   First MD Initiated Contact with Patient 07/09/21 0845     (approximate)   History   Foot Pain   HPI  James Werner is a 31 y.o. male who presents with 1 month of right foot swelling and pain.  Patient reports he works in [employer], the end of his day his right foot is swollen and his ankle is sore and red and warm.  He states that he started to feel pain in his calf and back of his thigh as well.  Denies injury to the area.  No history of blood clots.  Reports symptoms are usually better in the morning.     Physical Exam   Triage Vital Signs: ED Triage Vitals  Enc Vitals Group     BP 07/09/21 0840 140/82     Pulse Rate 07/09/21 0840 81     Resp 07/09/21 0840 18     Temp 07/09/21 0840 98 F (36.7 C)     Temp Source 07/09/21 0840 Oral     SpO2 07/09/21 0840 99 %     Weight 07/09/21 0847 92.3 kg (203 lb 7.8 oz)     Height 07/09/21 0847 1.626 m (5\' 4" )     Head Circumference --      Peak Flow --      Pain Score 07/09/21 0847 7     Pain Loc --      Pain Edu? --      Excl. in GC? --     Most recent vital signs: Vitals:   07/09/21 0840  BP: 140/82  Pulse: 81  Resp: 18  Temp: 98 F (36.7 C)  SpO2: 99%     General: Awake, no distress.  CV:  Good peripheral perfusion.  Resp:  Normal effort.  Abd:  No distention.  Other:  MS: Right leg, mild calf tenderness palpation, no significant swelling.  Normal pulses distally, no evidence of infection or bone abnormality.   ED Results / Procedures / Treatments   Labs (all labs ordered are listed, but only abnormal results are displayed) Labs Reviewed - No data to display   EKG     RADIOLOGY Ultrasound reviewed by me, no evidence of DVT, confirmed by radiology    PROCEDURES:  Critical Care performed:   Procedures   MEDICATIONS ORDERED IN ED: Medications - No data to display   IMPRESSION / MDM / ASSESSMENT AND PLAN /  ED COURSE  I reviewed the triage vital signs and the nursing notes.  Patient presents with right foot and ankle discomfort as above.  Does have a history of gout however no swelling or redness at this time.  Given intermittent symptoms possibility of rheumatologic disorder/arthritis, less likely DVT, will send for ultrasound  Ultrasound reviewed by me, no evidence of DVT, will trial course of prednisone, refer to rheumatology            FINAL CLINICAL IMPRESSION(S) / ED DIAGNOSES   Final diagnoses:  Arthralgia of right ankle     Rx / DC Orders   ED Discharge Orders          Ordered    predniSONE (DELTASONE) 50 MG tablet  Daily with breakfast        07/09/21 1052             Note:  This document was prepared using Dragon voice recognition software and may include unintentional dictation errors.  Jene Every, MD 07/09/21 [phone]

## 2021-07-09 NOTE — ED Triage Notes (Signed)
Presents with right foot swelling and pain  states this started about 1 month ago   no known injury  hx of gout   but states the pain is mainly at ankle and moves into great toe

## 2021-09-03 IMAGING — US US SCROTUM W/ DOPPLER COMPLETE
1 series · 13 of 25 positions shown · non-contrast
Comparison: 01/18/2016

CLINICAL DATA: RIGHT groin and testicular pain for 1 month
unbearable over past few days, had RIGHT epididymitis/orchitis in
4869

EXAM:
SCROTAL ULTRASOUND
DOPPLER ULTRASOUND OF THE TESTICLES
TECHNIQUE: Complete ultrasound examination of the testicles, epididymis, and
other scrotal structures was performed. Color and spectral Doppler
ultrasound were also utilized to evaluate blood flow to the
testicles.

[Series 1: us scrotum w/doppler · 13 of 69 slices shown]
[im 1/69]
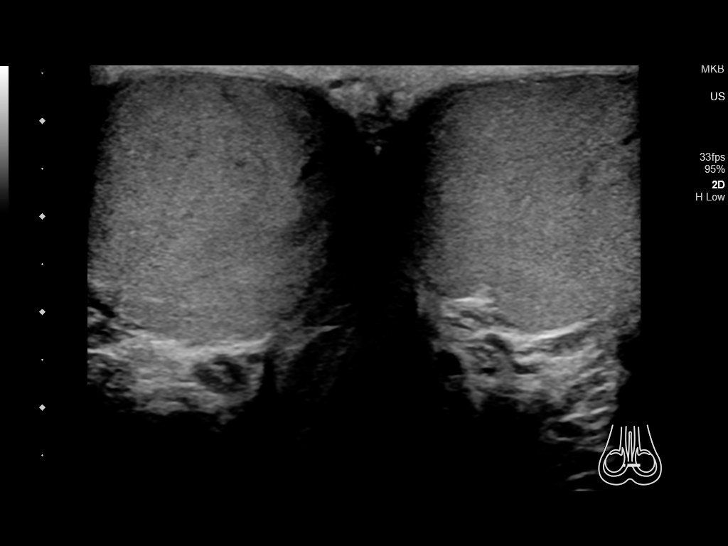
[im 6/69]
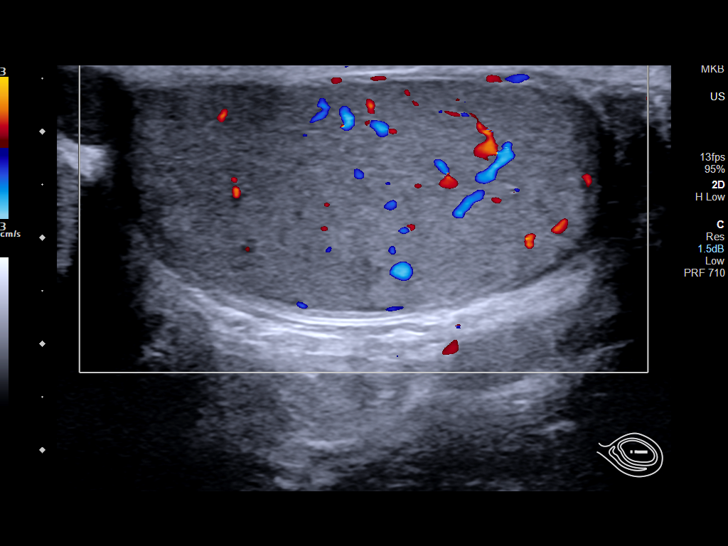
[im 12/69]
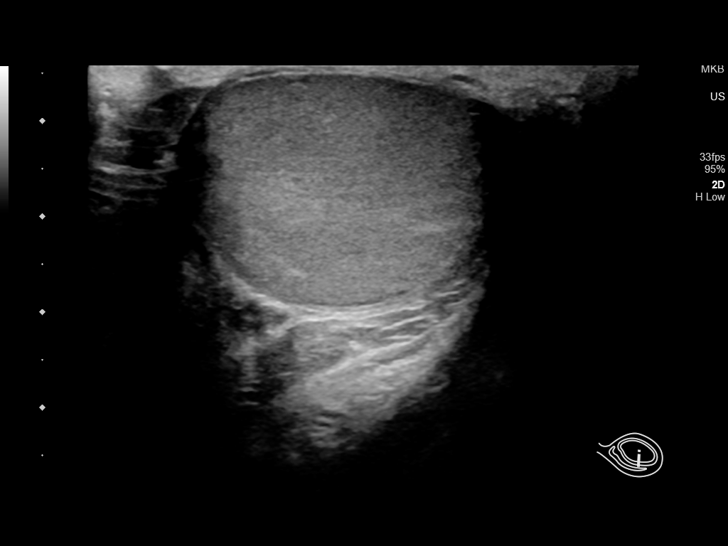
[im 18/69]
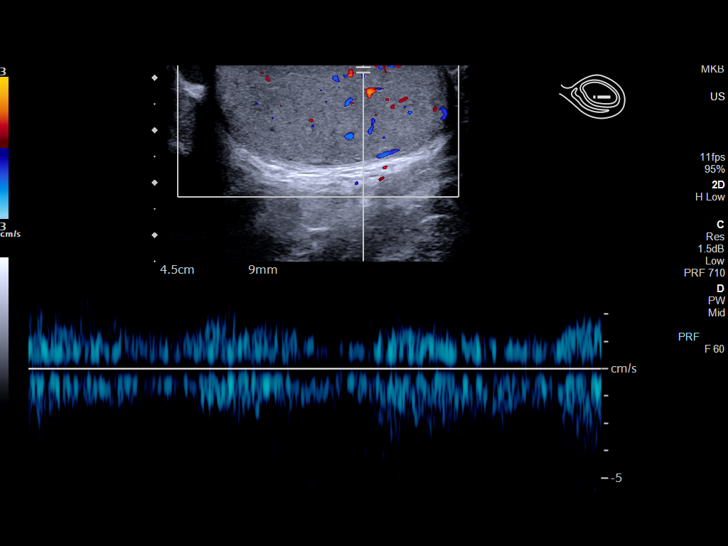
[im 23/69]
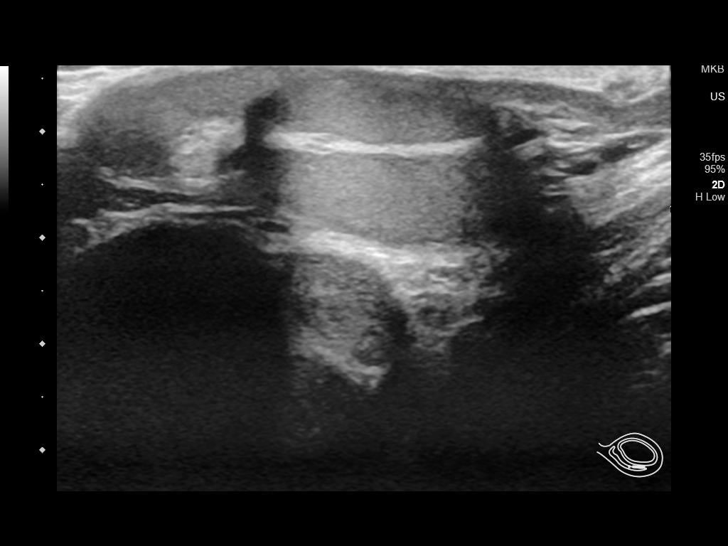
[im 29/69]
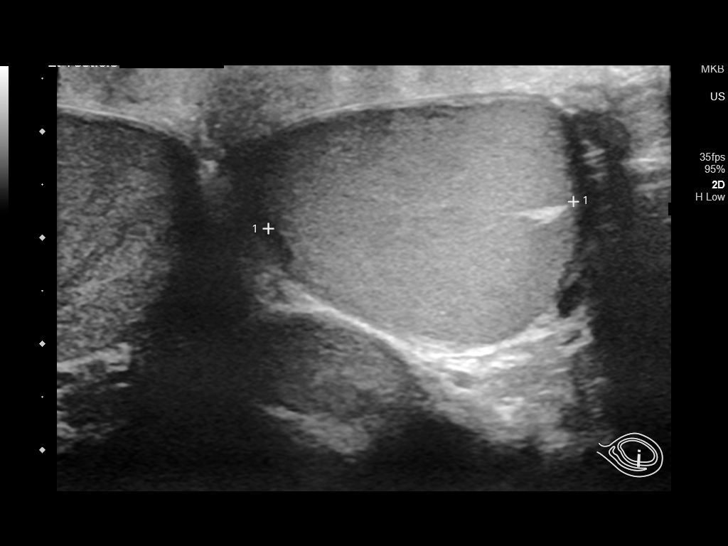
[im 35/69]
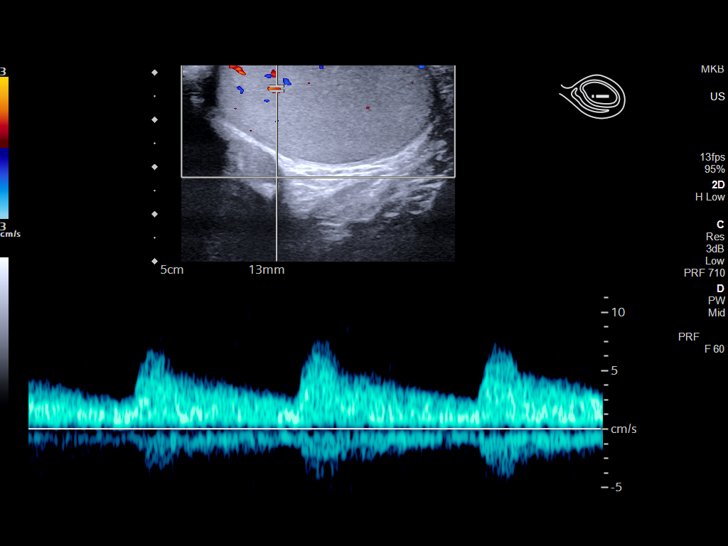
[im 40/69]
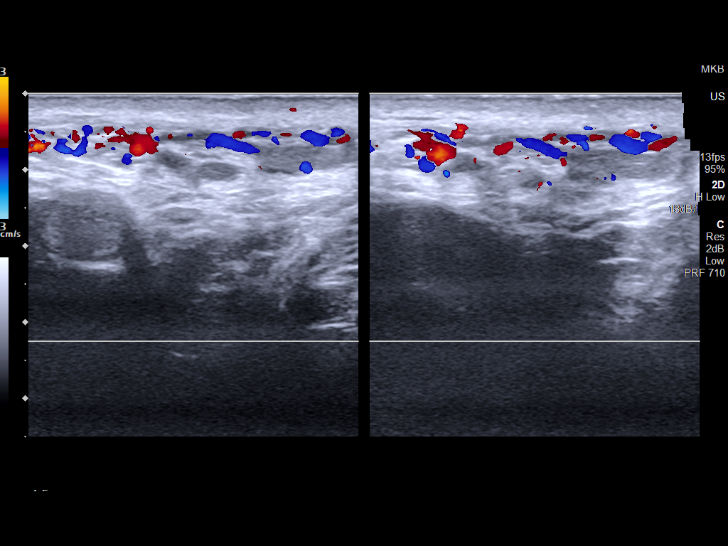
[im 46/69]
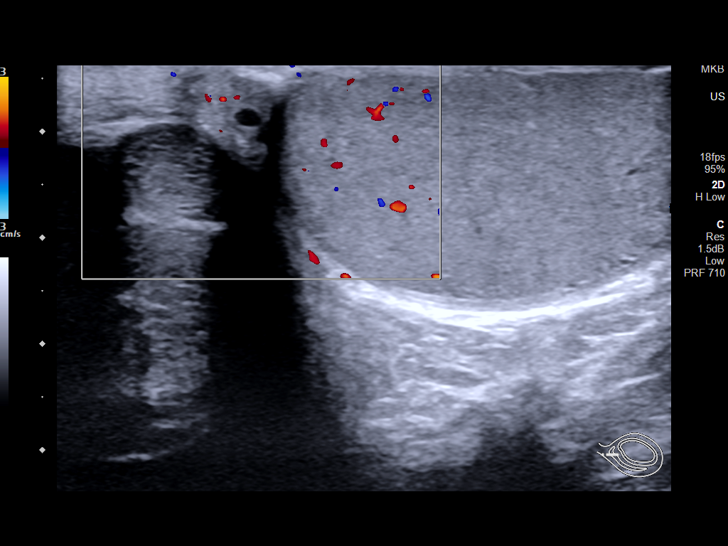
[im 52/69]
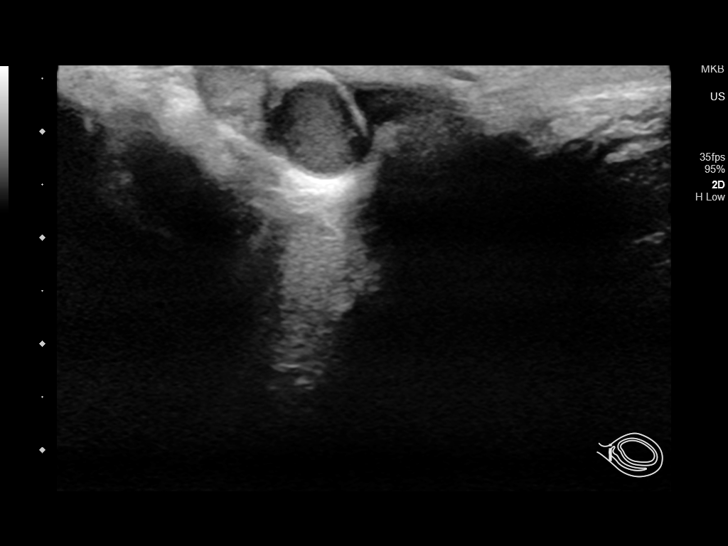
[im 57/69]
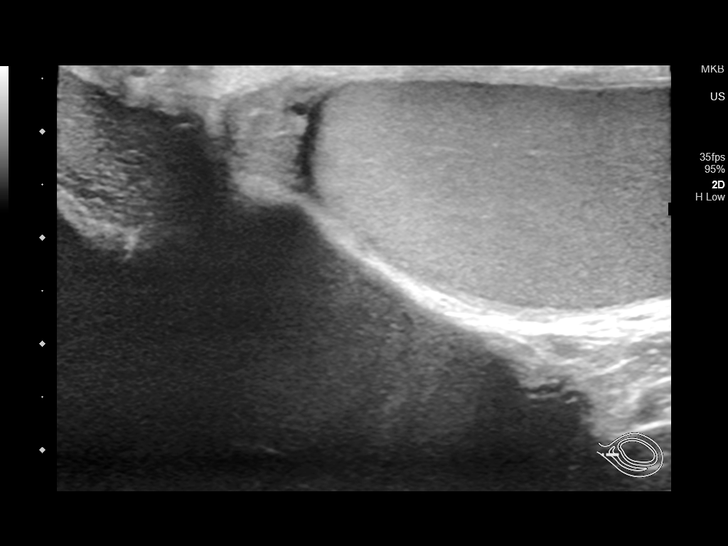
[im 63/69]
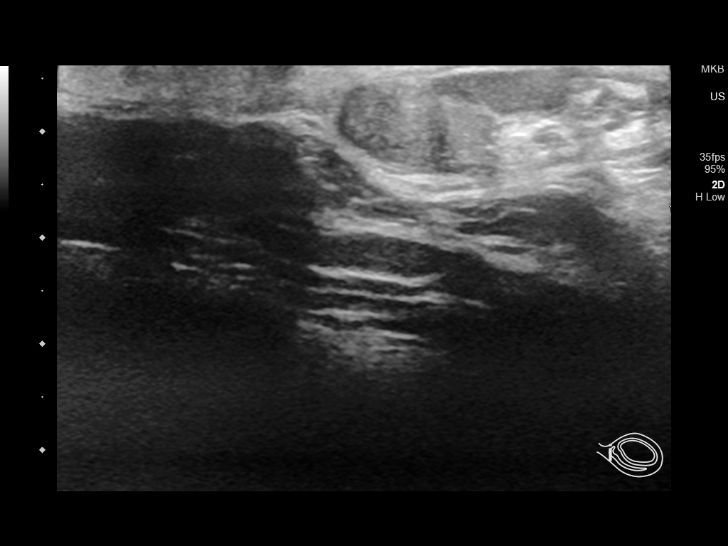
[im 69/69]
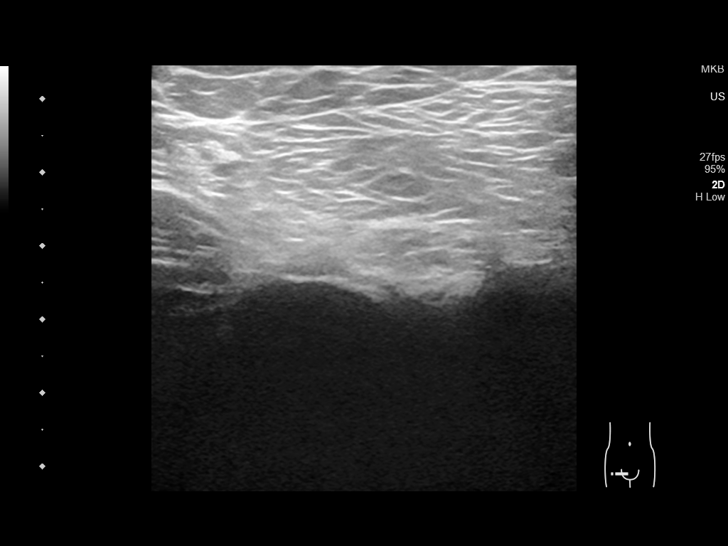

[13 of 25 positions shown; findings below may reference images not displayed]

FINDINGS: Right testicle

Measurements: 4.4 x 2.2 x 3.0 cm. Minimally inhomogeneous, which
could reflect prior tightest. No focal mass or calcification.
Internal blood flow present on color Doppler imaging.

Left testicle

Measurements: 4.6 x 2.4 x 2.9 cm. Normal echogenicity without mass
or calcification. Internal blood flow present on color Doppler
imaging, symmetric with RIGHT.

Right epididymis: Mildly heterogeneous. 2 x 2 x 3 mm cyst at
epididymal head

Left epididymis: 2 x 2 x 3 mm cyst at epididymal head. Otherwise
normal.

Hydrocele:  None visualized.

Varicocele:  None visualized.

Pulsed Doppler interrogation of both testes demonstrates normal low
resistance arterial and venous waveforms bilaterally.
IMPRESSION: Tiny cysts at the epididymal heads bilaterally.

No significant testicular or epididymal sonographic abnormalities
identified.

Minimal inhomogeneity of echogenicity in the RIGHT testis similar to
the previous exam which may reflect sequela of prior epididymitis.

## 2021-10-18 ENCOUNTER — Other Ambulatory Visit: Payer: Self-pay

## 2021-10-18 ENCOUNTER — Ambulatory Visit
Admission: EM | Admit: 2021-10-18 | Discharge: 2021-10-18 | Disposition: A | Discharge status: Home / Self Care | Payer: Self-pay

## 2021-10-18 DIAGNOSIS — M25562 Pain in left knee: Secondary | ICD-10-CM

## 2021-10-18 DIAGNOSIS — M109 Gout, unspecified: Secondary | ICD-10-CM

## 2021-10-18 DIAGNOSIS — M25462 Effusion, left knee: Secondary | ICD-10-CM

## 2021-10-18 MED ORDER — METHYLPREDNISOLONE SODIUM SUCC 125 MG IJ SOLR
125.0000 mg | Freq: Once | INTRAMUSCULAR | Status: AC
  Administered 2021-10-18: 125 mg via INTRAMUSCULAR

## 2021-10-18 MED ORDER — HYDROCODONE-ACETAMINOPHEN 7.5-325 MG PO TABS: 1.0000 | ORAL_TABLET | Freq: Four times a day (QID) | ORAL | 0 refills | Status: AC | PRN

## 2021-10-18 MED ORDER — PREDNISONE 10 MG PO TABS: ORAL_TABLET | ORAL | 0 refills | Status: AC

## 2021-10-18 NOTE — ED Provider Notes (Signed)
?[facility] ? ? ? ?CSN: [id number] ?Arrival date & time: 10/18/21  1101 ? ? ?  ? ?History   ?Chief Complaint ?Chief Complaint  ?Patient presents with  ? Gout  ?  Left Knee  ? ? ?HPI ?James Werner is a 31 y.o. male presenting for right knee pain and swelling.  Patient believes he is having a flareup of gout.  Symptoms started yesterday.  Patient says he has been on his feet a lot and thinks he has been favoring his left side since he has had gout flareup in his right knee recently.  Denies any injury or fall.  Increased pain when trying to straighten the leg or flex beyond 90 degrees also increased pain with prolonged sitting or standing.  Patient has a history of chronic gout, diagnosed in 2017.  He is followed by rheumatology.  Patient's last appointment was 10/03/2021.  Patient presently taking allopurinol and colchicine.  He avoids NSAIDs due to history of stage IIIa CKD.  Patient says he was told at his recent appointment to avoid NSAIDs.  He says he normally takes Aleve when he gets a flareup but has not taken any NSAIDs since his rheumatologist recommended against it.  Has follow-up in 1.5 months.  No other complaints today. ? ?HPI ? ?History reviewed. No pertinent past medical history. ? ?There are no problems to display for this patient. ? ? ?History reviewed. No pertinent surgical history. ? ? ? ? ?Home Medications   ? ?Prior to Admission medications   ?Medication Sig Start Date End Date Taking? Authorizing Provider  ?allopurinol (ZYLOPRIM) 300 MG tablet Take 300 mg by mouth daily. 10/03/21  Yes [provider]  ?colchicine 0.6 MG tablet Take by mouth. 10/03/21  Yes [provider]  ?HYDROcodone-acetaminophen ([place]) 7.5-325 MG tablet Take 1 tablet by mouth every 6 (six) hours as needed for up to 3 days for moderate pain. 10/18/21 10/21/21 Yes Danton Clap, PA-C  ?predniSONE (DELTASONE) 10 MG tablet Take 6 pills on day 1 and decrease by 1 tab daily until complete  10/18/21  Yes Danton Clap, PA-C  ? ? ?Family History ?History reviewed. No pertinent family history. ? ?Social History ?Social History  ? ?Tobacco Use  ? Smoking status: Never  ? Smokeless tobacco: Never  ?Vaping Use  ? Vaping Use: Never used  ?Substance Use Topics  ? Alcohol use: Yes  ? Drug use: No  ? ? ? ?Allergies   ?Patient has no known allergies. ? ? ?Review of Systems ?Review of Systems  ?Constitutional:  Negative for fever.  ?Musculoskeletal:  Positive for arthralgias, gait problem and joint swelling. Negative for back pain.  ?Skin:  Negative for color change and wound.  ?Neurological:  Negative for weakness and numbness.  ? ? ?Physical Exam ?Triage Vital Signs ?ED Triage Vitals  ?Enc Vitals Group  ?   BP   ?   Pulse   ?   Resp   ?   Temp   ?   Temp src   ?   SpO2   ?   Weight   ?   Height   ?   Head Circumference   ?   Peak Flow   ?   Pain Score   ?   Pain Loc   ?   Pain Edu?   ?   Excl. in [place]?   ? ?No data found. ? ?Updated Vital Signs ?BP 139/87 (BP Location: Left Arm)   Pulse Marland Kitchen)  103   Temp 100.1 ?F (37.8 ?C) (Oral)   Resp 18   Ht 5\' 5"  (1.651 m)   Wt 195 lb (88.5 kg)   SpO2 100%   BMI 32.45 kg/m?  ?  ? ?Physical Exam ?Vitals and nursing note reviewed.  ?Constitutional:   ?   General: He is not in acute distress. ?   Appearance: Normal appearance. He is well-developed. He is not ill-appearing.  ?HENT:  ?   Head: Normocephalic and atraumatic.  ?Eyes:  ?   General: No scleral icterus. ?   Conjunctiva/sclera: Conjunctivae normal.  ?Cardiovascular:  ?   Rate and Rhythm: Normal rate and regular rhythm.  ?   Pulses: Normal pulses.  ?Pulmonary:  ?   Effort: Pulmonary effort is normal. No respiratory distress.  ?Abdominal:  ?   Palpations: Abdomen is soft.  ?Musculoskeletal:  ?   Cervical back: Neck supple.  ?   Left knee: Swelling (moderate swelling of anterior/medial knee) present. No erythema. Decreased range of motion (keeping leg at 90 degrees flexion. States it hurts to extend from there or  flex). Tenderness present over the medial joint line and lateral joint line.  ?   Comments: +increased warmth of left knee  ?Skin: ?   General: Skin is warm and dry.  ?   Capillary Refill: Capillary refill takes less than 2 seconds.  ?Neurological:  ?   General: No focal deficit present.  ?   Mental Status: He is alert. Mental status is at baseline.  ?   Motor: No weakness.  ?   Gait: Gait abnormal.  ?Psychiatric:     ?   Mood and Affect: Mood normal.     ?   Behavior: Behavior normal.     ?   Thought Content: Thought content normal.  ? ? ? ?UC Treatments / Results  ?Labs ?(all labs ordered are listed, but only abnormal results are displayed) ?Labs Reviewed - No data to display ? ?EKG ? ? ?Radiology ?No results found. ? ?Procedures ?Procedures (including critical care time) ? ?Medications Ordered in UC ?Medications  ?methylPREDNISolone sodium succinate (SOLU-MEDROL) 125 mg/2 mL injection 125 mg (125 mg Intramuscular Given 10/18/21 1140)  ? ? ?Initial Impression / Assessment and Plan / UC Course  ?I have reviewed the triage vital signs and the nursing notes. ? ?Pertinent labs & imaging results that were available during my care of the patient were reviewed by me and considered in my medical decision making (see chart for details). ? ?30 year old male with history of chronic gout presents for left knee pain, swelling and increased warmth since yesterday.  Denies injury.  States symptoms are consistent with gout flareups he said in the past and he has had a flareup of gout in the right knee previously.  Patient takes allopurinol and colchicine daily.  Takes prednisone when he gets acute flareups.  Has not taken any OTC meds as he was advised to not take any NSAIDs due to history of stage III CKD.  Patient's presentation today consistent with acute gout flareup.  Patient reports he has received injections of corticosteroids in the past which been very helpful.  Patient given 125 IM Solu-Medrol in clinic today.  Start  prednisone tomorrow.  Short supply of Norco also sent for acute pain relief if needed.  I did review the controlled substance database.  Also reviewed RICE guidelines.  Reviewed continue to keep follow-up appoint with rheumatologist.  Follow-up here as needed.  Work note given. ? ? ?  Final Clinical Impressions(s) / UC Diagnoses  ? ?Final diagnoses:  ?Acute gout of left knee, unspecified cause  ?Pain and swelling of left knee  ? ? ? ?Discharge Instructions   ? ?  ?-Continue allopurinol and colchicine at home.  I have sent prednisone for you to start tomorrow.  Also Norco as needed for severe pain now. ?- Ice the knee every couple hours to help with swelling and keep it elevated.  Also consider Ace wrap. ?- Follow-up with your rheumatologist as scheduled. ?- Return or go to ER if not improving over the next couple days or for worsening symptoms. ? ? ? ? ?ED Prescriptions   ? ? Medication Sig Dispense Auth. Provider  ? predniSONE (DELTASONE) 10 MG tablet Take 6 pills on day 1 and decrease by 1 tab daily until complete 21 tablet Laurene Footman B, PA-C  ? HYDROcodone-acetaminophen (NORCO) 7.5-325 MG tablet Take 1 tablet by mouth every 6 (six) hours as needed for up to 3 days for moderate pain. 12 tablet Danton Clap, PA-C  ? ?  ? ?I have reviewed the PDMP during this encounter. ?  ?Danton Clap, PA-C ?10/18/21 1145 ? ?

## 2021-10-18 NOTE — ED Triage Notes (Signed)
Pt c/o left knee pain x2days.  ? ?Pt has gout and believes it is a gout flare up.  ? ?Pt states that he woke up and it was "swollen and hot" ?

## 2021-10-18 NOTE — Discharge Instructions (Signed)
-  Continue allopurinol and colchicine at home.  I have sent prednisone for you to start tomorrow.  Also Norco as needed for severe pain now. ?- Ice the knee every couple hours to help with swelling and keep it elevated.  Also consider Ace wrap. ?- Follow-up with your rheumatologist as scheduled. ?- Return or go to ER if not improving over the next couple days or for worsening symptoms. ?

## 2023-05-04 IMAGING — US US EXTREM LOW VENOUS*R*
1 series · 14 of 24 positions shown · non-contrast
Comparison: None.

CLINICAL DATA: Calf pain with right foot pain and swelling for 1
month. Clinical concern for DVT.

EXAM:
RIGHT LOWER EXTREMITY VENOUS DOPPLER ULTRASOUND
TECHNIQUE: Gray-scale sonography with compression, as well as color and duplex
ultrasound, were performed to evaluate the deep venous system(s)
from the level of the common femoral vein through the popliteal and
proximal calf veins.

[Series 1: us venous img lower uni right (dvt) · portal-venous · 14 of 27 slices shown]
[im 1/27]
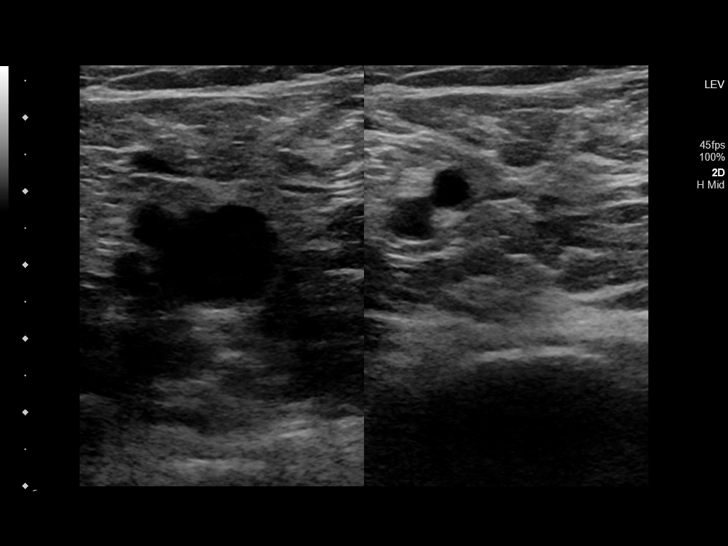
[im 3/27]
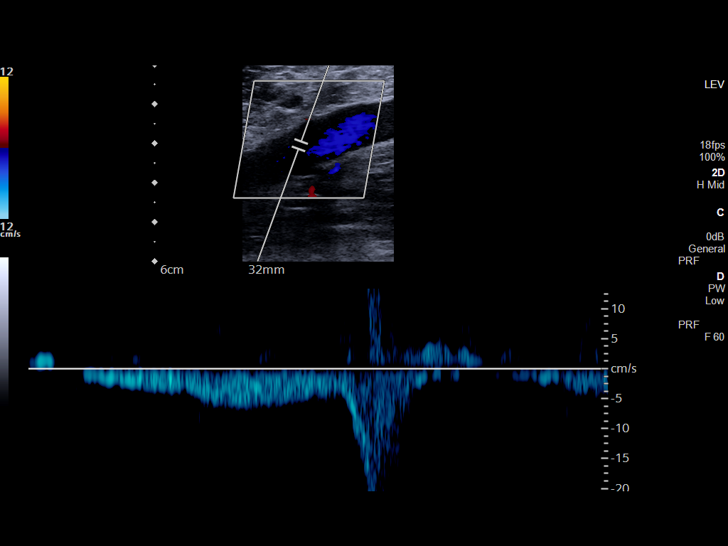
[im 5/27]
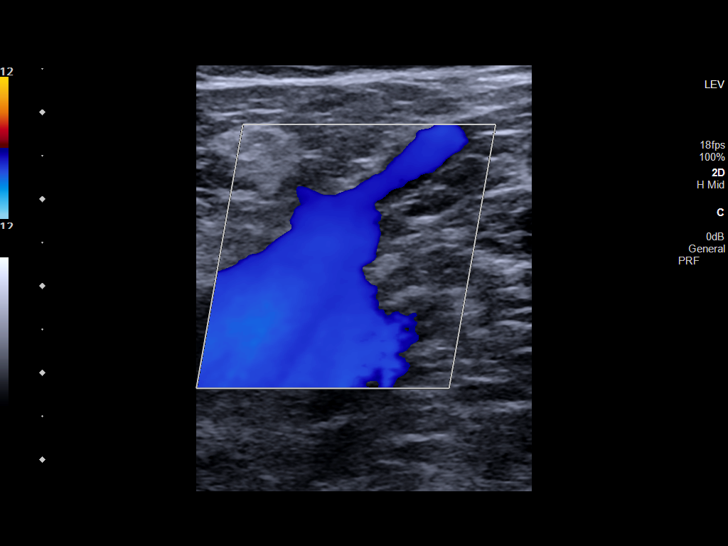
[im 7/27]
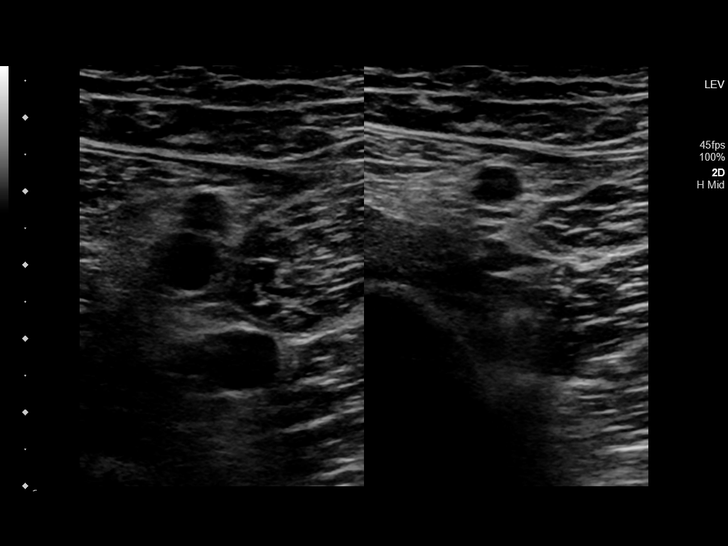
[im 8/27]
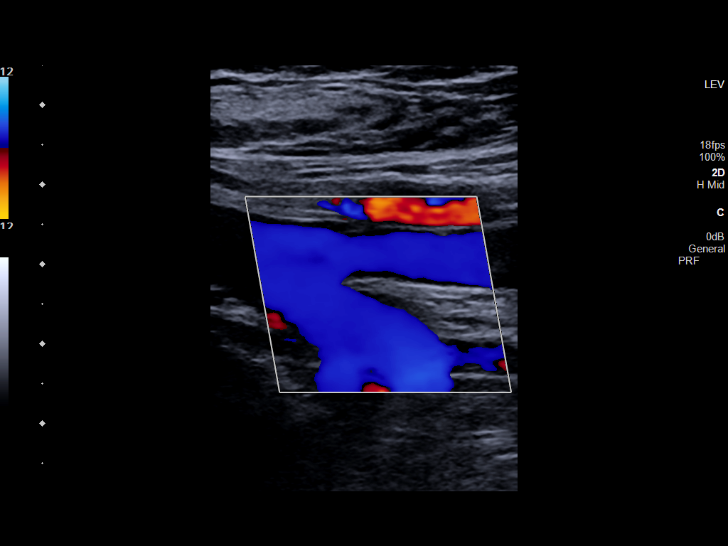
[im 11/27]
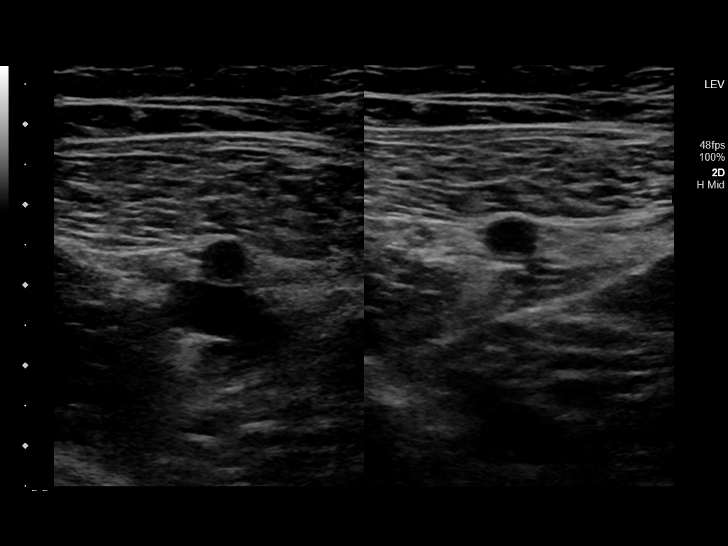
[im 13/27]
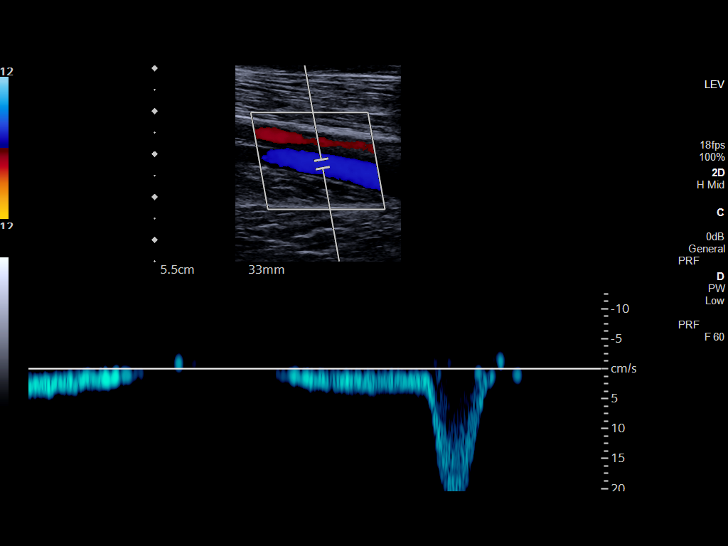
[im 14/27]
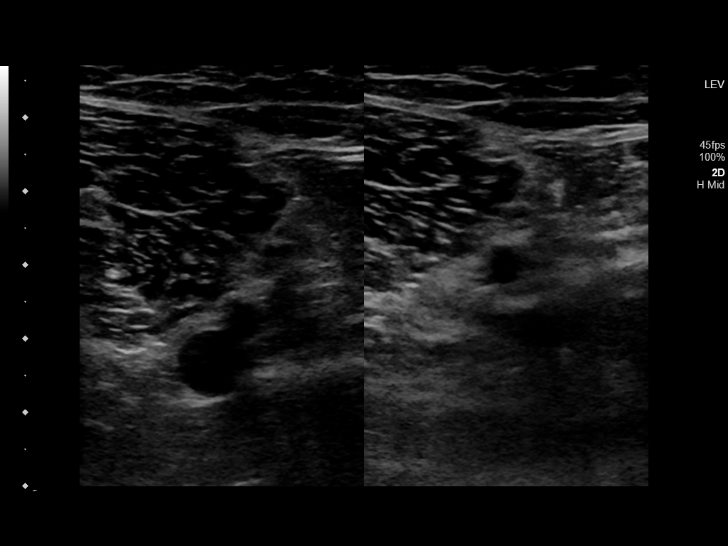
[im 16/27]
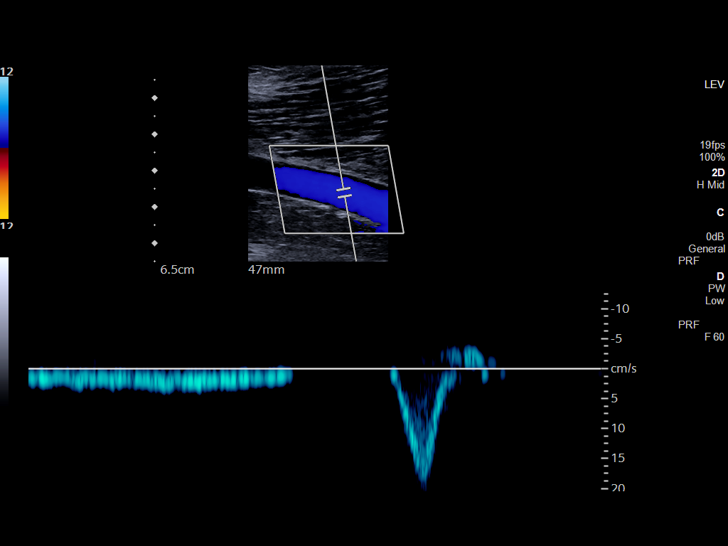
[im 19/27]
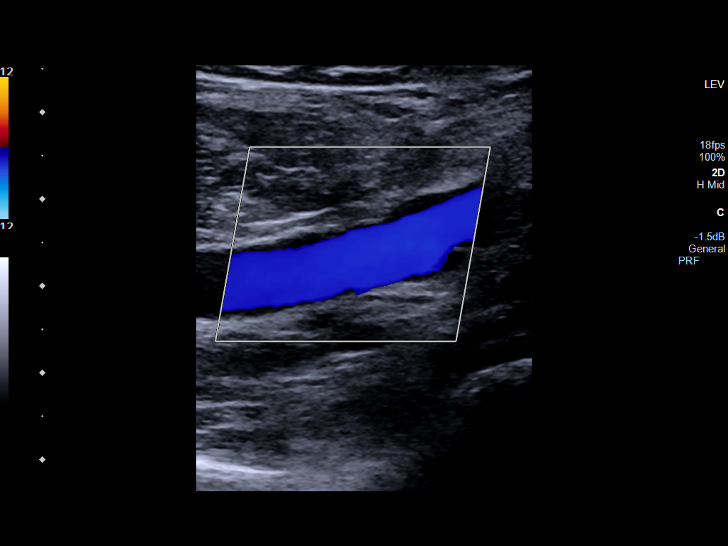
[im 21/27]
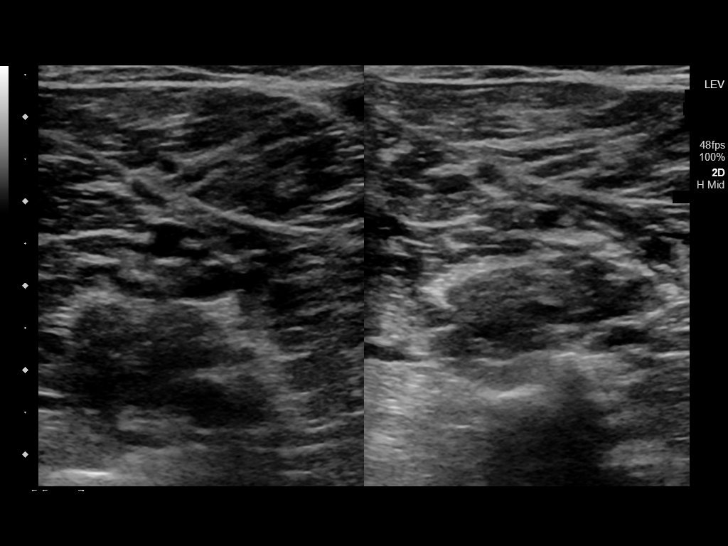
[im 22/27]
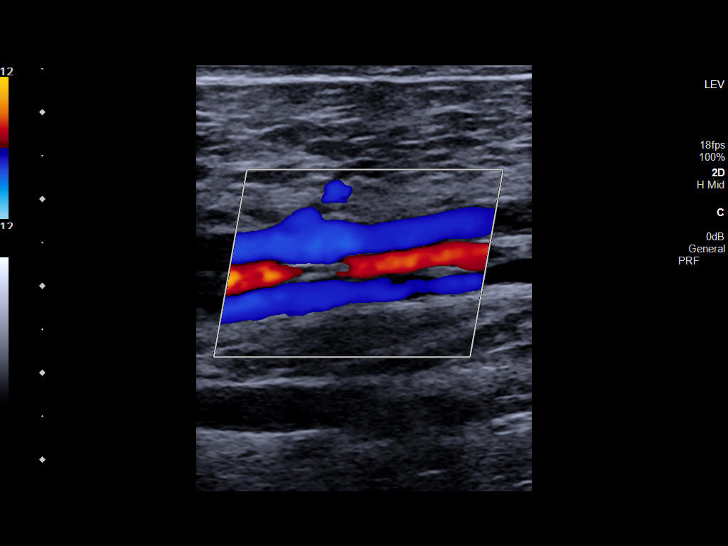
[im 24/27]
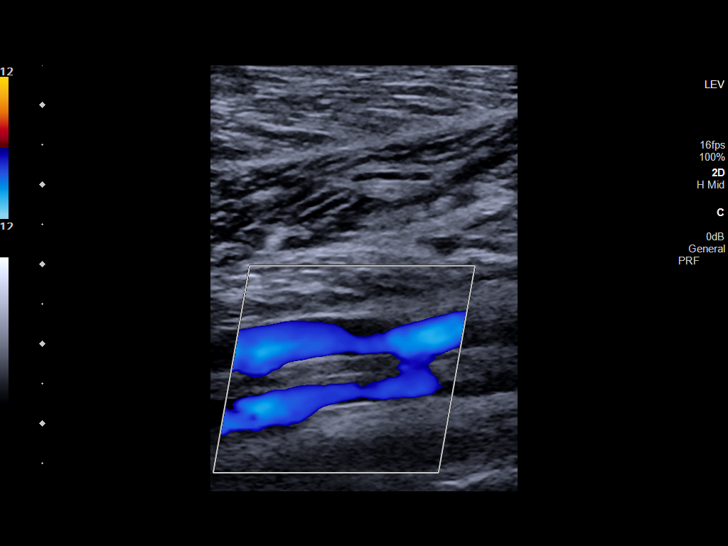
[im 27/27]
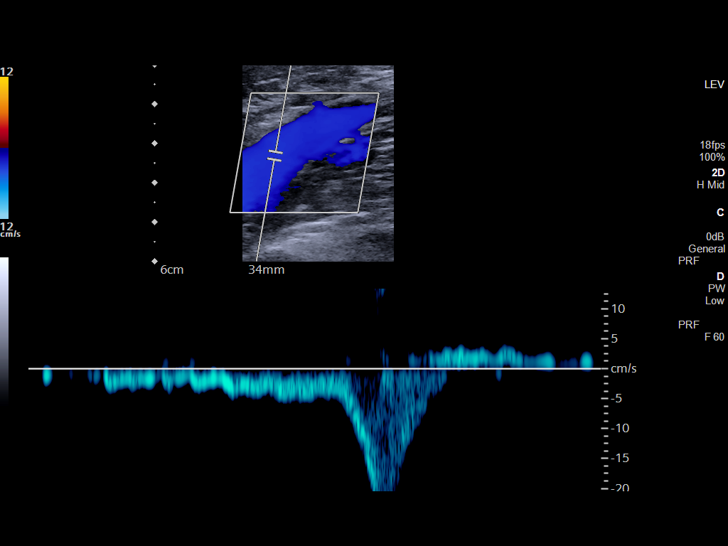

[14 of 24 positions shown; findings below may reference images not displayed]

FINDINGS: VENOUS

Normal compressibility of the common femoral, superficial femoral,
and popliteal veins, as well as the visualized calf veins.
Visualized portions of profunda femoral vein and great saphenous
vein unremarkable. No filling defects to suggest DVT on grayscale or
color Doppler imaging. Doppler waveforms show normal direction of
venous flow, normal respiratory plasticity and response to
augmentation.

Limited views of the contralateral common femoral vein are
unremarkable.

OTHER

None.

Limitations: none
IMPRESSION: No evidence of acute right lower extremity deep venous thrombosis.

## 2023-06-28 ENCOUNTER — Emergency Department
Admission: EM | Admit: 2023-06-28 | Discharge: 2023-06-28 | Disposition: A | Discharge status: Home / Self Care | Payer: Self-pay | Attending: Emergency Medicine | Admitting: Emergency Medicine

## 2023-06-28 ENCOUNTER — Other Ambulatory Visit: Payer: Self-pay

## 2023-06-28 DIAGNOSIS — K0889 Other specified disorders of teeth and supporting structures: Secondary | ICD-10-CM | POA: Insufficient documentation

## 2023-06-28 MED ORDER — CLINDAMYCIN HCL 300 MG PO CAPS: 300.0000 mg | ORAL_CAPSULE | Freq: Three times a day (TID) | ORAL | 0 refills | Status: AC

## 2023-06-28 MED ORDER — TRAMADOL HCL 50 MG PO TABS
50.0000 mg | ORAL_TABLET | Freq: Once | ORAL | Status: AC
Start: 2023-06-28 — End: 2023-06-28
  Administered 2023-06-28: 50 mg via ORAL
  Filled 2023-06-28: qty 1

## 2023-06-28 MED ORDER — TRAMADOL HCL 50 MG PO TABS: 50.0000 mg | ORAL_TABLET | Freq: Four times a day (QID) | ORAL | 0 refills | Status: AC | PRN

## 2023-06-28 MED ORDER — CLINDAMYCIN HCL 150 MG PO CAPS
300.0000 mg | ORAL_CAPSULE | Freq: Once | ORAL | Status: AC
  Administered 2023-06-28: 300 mg via ORAL
  Filled 2023-06-28: qty 2

## 2023-06-28 NOTE — Discharge Instructions (Signed)
Please take antibiotics as prescribed for their entire course.  Please take your pain medication as needed but only as written.  Do not drink alcohol or drive while taking this medication.  Return to the emergency department for any worsening pain swelling or development of fever otherwise please follow-up with a dentist as soon as you are able for further evaluation.

## 2023-06-28 NOTE — ED Provider Notes (Signed)
   [facility] Provider Note    Event Date/Time   First MD Initiated Contact with Patient 06/28/23 1043     (approximate)  History   Chief Complaint: Dental Pain  HPI  James Werner is a 33 y.o. male with no significant past medical history who presents to the emergency department for right lower jaw pain.  According to the patient since last night he has been experiencing pain around the bottom of his wisdom tooth in the right lower jaw.  Patient states he feels like it is mildly swollen today.  No obvious or appreciable swelling on my exam.  No fever.  Physical Exam   Triage Vital Signs: ED Triage Vitals  Encounter Vitals Group     BP 06/28/23 1018 (!) 155/112     Systolic BP Percentile --      Diastolic BP Percentile --      Pulse Rate 06/28/23 1018 78     Resp 06/28/23 1018 15     Temp 06/28/23 1018 98.2 F (36.8 C)     Temp Source 06/28/23 1018 Oral     SpO2 06/28/23 1018 94 %     Weight 06/28/23 1017 226 lb 1.6 oz (102.6 kg)     Height 06/28/23 1017 5\' 4"  (1.626 m)     Head Circumference --      Peak Flow --      Pain Score 06/28/23 1016 9     Pain Loc --      Pain Education --      Exclude from Growth Chart --     Most recent vital signs: Vitals:   06/28/23 1018  BP: (!) 155/112  Pulse: 78  Resp: 15  Temp: 98.2 F (36.8 C)  SpO2: 94%    General: Awake, no distress.  CV:  Good peripheral perfusion.  Regular rate and rhythm  Resp:  Normal effort.  Equal breath sounds bilaterally.  Abd:  No distention.  Other:  Patient has overall well-appearing teeth, no obvious dental caries or abscess.   ED Results / Procedures / Treatments   MEDICATIONS ORDERED IN ED: Medications  traMADol (ULTRAM) tablet 50 mg (has no administration in time range)  clindamycin (CLEOCIN) capsule 300 mg (has no administration in time range)     IMPRESSION / MDM / ASSESSMENT AND PLAN / ED COURSE  I reviewed the triage vital signs and the nursing  notes.  Patient's presentation is most consistent with acute illness / injury with system symptoms.  Patient presents emergency department for pain to his right jaw/base of his right molars.  No obvious abscess on my evaluation overall good dentition.  No appreciable swelling on my exam though the patient states he feels like there is some mild swelling to his right lower face.  No swelling within the throat, soft nontender floor the mouth.  Suspect likely early dental infection.  Will prescribe antibiotics have the patient follow-up with a dentist as soon as he is able.  I discussed return precaution.  Patient agreeable to plan.  FINAL CLINICAL IMPRESSION(S) / ED DIAGNOSES   Dental pain   Note:  This document was prepared using Dragon voice recognition software and may include unintentional dictation errors.   Minna Antis, MD 06/28/23 1104

## 2023-06-28 NOTE — ED Triage Notes (Signed)
Pt c/o R lower dental pain. Pt has no obvious dental caries but reports he still has his wisdom teeth. Swelling to R lower jaw.

## 2023-08-19 ENCOUNTER — Ambulatory Visit: Payer: Self-pay | Admitting: Podiatry
